# Patient Record
Sex: Female | Born: 1935 | Race: Black or African American | Hispanic: No | Marital: Single | Smoking: Never smoker
Health system: Southern US, Community
[De-identification: ages and names within clinical notes are randomized; demographics above are authoritative.]

## PROBLEM LIST (undated history)

## (undated) DIAGNOSIS — I4891 Unspecified atrial fibrillation: Secondary | ICD-10-CM

## (undated) DIAGNOSIS — M40209 Unspecified kyphosis, site unspecified: Secondary | ICD-10-CM

## (undated) DIAGNOSIS — R001 Bradycardia, unspecified: Secondary | ICD-10-CM

## (undated) DIAGNOSIS — D649 Anemia, unspecified: Secondary | ICD-10-CM

## (undated) DIAGNOSIS — F0390 Unspecified dementia without behavioral disturbance: Secondary | ICD-10-CM

## (undated) DIAGNOSIS — R5381 Other malaise: Secondary | ICD-10-CM

## (undated) DIAGNOSIS — Z86718 Personal history of other venous thrombosis and embolism: Secondary | ICD-10-CM

## (undated) DIAGNOSIS — I10 Essential (primary) hypertension: Secondary | ICD-10-CM

## (undated) DIAGNOSIS — L97919 Non-pressure chronic ulcer of unspecified part of right lower leg with unspecified severity: Secondary | ICD-10-CM

## (undated) DIAGNOSIS — E785 Hyperlipidemia, unspecified: Secondary | ICD-10-CM

## (undated) HISTORY — PX: OTHER SURGICAL HISTORY: SHX169

## (undated) HISTORY — DX: Essential (primary) hypertension: I10

---

## 2001-03-30 ENCOUNTER — Encounter: Payer: Self-pay | Admitting: Emergency Medicine

## 2001-03-30 ENCOUNTER — Emergency Department (HOSPITAL_COMMUNITY): Admission: EM | Admit: 2001-03-30 | Discharge: 2001-03-30 | Payer: Self-pay | Admitting: Emergency Medicine

## 2003-08-08 ENCOUNTER — Ambulatory Visit (HOSPITAL_COMMUNITY): Admission: RE | Admit: 2003-08-08 | Discharge: 2003-08-08 | Payer: Self-pay | Admitting: Cardiology

## 2003-09-22 ENCOUNTER — Emergency Department (HOSPITAL_COMMUNITY): Admission: EM | Admit: 2003-09-22 | Discharge: 2003-09-22 | Payer: Self-pay | Admitting: *Deleted

## 2003-12-19 ENCOUNTER — Ambulatory Visit: Payer: Self-pay | Admitting: Internal Medicine

## 2004-03-01 ENCOUNTER — Ambulatory Visit: Payer: Self-pay | Admitting: Internal Medicine

## 2004-07-16 ENCOUNTER — Ambulatory Visit: Payer: Self-pay | Admitting: Internal Medicine

## 2004-08-06 ENCOUNTER — Ambulatory Visit: Payer: Self-pay | Admitting: Internal Medicine

## 2004-08-11 ENCOUNTER — Ambulatory Visit: Payer: Self-pay | Admitting: Internal Medicine

## 2004-10-04 ENCOUNTER — Ambulatory Visit: Payer: Self-pay | Admitting: Internal Medicine

## 2005-01-20 ENCOUNTER — Emergency Department (HOSPITAL_COMMUNITY): Admission: EM | Admit: 2005-01-20 | Discharge: 2005-01-21 | Payer: Self-pay | Admitting: Emergency Medicine

## 2005-03-25 ENCOUNTER — Ambulatory Visit: Payer: Self-pay | Admitting: Internal Medicine

## 2005-04-16 ENCOUNTER — Emergency Department (HOSPITAL_COMMUNITY): Admission: EM | Admit: 2005-04-16 | Discharge: 2005-04-16 | Payer: Self-pay | Admitting: *Deleted

## 2006-06-30 ENCOUNTER — Ambulatory Visit: Payer: Self-pay | Admitting: Internal Medicine

## 2006-10-24 ENCOUNTER — Observation Stay (HOSPITAL_COMMUNITY): Admission: EM | Admit: 2006-10-24 | Discharge: 2006-10-25 | Payer: Self-pay | Admitting: Emergency Medicine

## 2006-10-25 ENCOUNTER — Ambulatory Visit: Payer: Self-pay | Admitting: Internal Medicine

## 2006-10-26 ENCOUNTER — Telehealth (INDEPENDENT_AMBULATORY_CARE_PROVIDER_SITE_OTHER): Payer: Self-pay | Admitting: *Deleted

## 2006-10-31 ENCOUNTER — Telehealth (INDEPENDENT_AMBULATORY_CARE_PROVIDER_SITE_OTHER): Payer: Self-pay | Admitting: *Deleted

## 2006-11-01 DIAGNOSIS — I10 Essential (primary) hypertension: Secondary | ICD-10-CM | POA: Insufficient documentation

## 2006-11-07 ENCOUNTER — Ambulatory Visit: Payer: Self-pay | Admitting: Internal Medicine

## 2006-11-07 DIAGNOSIS — K2901 Acute gastritis with bleeding: Secondary | ICD-10-CM | POA: Insufficient documentation

## 2006-11-07 LAB — CONVERTED CEMR LAB: Hemoglobin: 12.1 g/dL

## 2007-01-16 ENCOUNTER — Telehealth: Payer: Self-pay | Admitting: Internal Medicine

## 2007-02-21 ENCOUNTER — Telehealth: Payer: Self-pay | Admitting: Internal Medicine

## 2007-04-24 ENCOUNTER — Encounter: Payer: Self-pay | Admitting: Internal Medicine

## 2007-05-10 ENCOUNTER — Telehealth: Payer: Self-pay | Admitting: Internal Medicine

## 2007-05-11 ENCOUNTER — Emergency Department (HOSPITAL_COMMUNITY): Admission: EM | Admit: 2007-05-11 | Discharge: 2007-05-11 | Payer: Self-pay | Admitting: Emergency Medicine

## 2007-05-22 ENCOUNTER — Ambulatory Visit: Payer: Self-pay | Admitting: Internal Medicine

## 2007-05-22 DIAGNOSIS — D649 Anemia, unspecified: Secondary | ICD-10-CM

## 2007-05-22 LAB — CONVERTED CEMR LAB
Basophils Absolute: 0.1 10*3/uL (ref 0.0–0.1)
CO2: 32 meq/L (ref 19–32)
Chloride: 107 meq/L (ref 96–112)
Eosinophils Absolute: 0.2 10*3/uL (ref 0.0–0.7)
Ferritin: 100 ng/mL (ref 10.0–291.0)
GFR calc non Af Amer: 63 mL/min
Lymphocytes Relative: 19.8 % (ref 12.0–46.0)
MCV: 87.2 fL (ref 78.0–100.0)
Monocytes Absolute: 0.5 10*3/uL (ref 0.1–1.0)
Neutro Abs: 5.8 10*3/uL (ref 1.4–7.7)
Neutrophils Relative %: 69.7 % (ref 43.0–77.0)
Platelets: 226 10*3/uL (ref 150–400)
Potassium: 4.1 meq/L (ref 3.5–5.1)
RBC: 4.15 M/uL (ref 3.87–5.11)
WBC: 8.2 10*3/uL (ref 4.5–10.5)

## 2007-05-28 ENCOUNTER — Encounter: Payer: Self-pay | Admitting: Internal Medicine

## 2007-05-29 ENCOUNTER — Ambulatory Visit: Payer: Self-pay | Admitting: Internal Medicine

## 2007-06-14 ENCOUNTER — Telehealth: Payer: Self-pay | Admitting: Internal Medicine

## 2007-06-19 ENCOUNTER — Telehealth: Payer: Self-pay | Admitting: Internal Medicine

## 2007-07-19 ENCOUNTER — Telehealth: Payer: Self-pay | Admitting: Internal Medicine

## 2007-07-24 ENCOUNTER — Telehealth: Payer: Self-pay | Admitting: Internal Medicine

## 2007-12-21 ENCOUNTER — Telehealth: Payer: Self-pay | Admitting: Internal Medicine

## 2008-05-23 ENCOUNTER — Encounter (INDEPENDENT_AMBULATORY_CARE_PROVIDER_SITE_OTHER): Payer: Self-pay | Admitting: *Deleted

## 2008-08-26 ENCOUNTER — Telehealth: Payer: Self-pay | Admitting: Internal Medicine

## 2008-12-04 ENCOUNTER — Ambulatory Visit: Payer: Self-pay | Admitting: Internal Medicine

## 2008-12-04 DIAGNOSIS — I872 Venous insufficiency (chronic) (peripheral): Secondary | ICD-10-CM | POA: Insufficient documentation

## 2009-12-29 ENCOUNTER — Encounter: Payer: Self-pay | Admitting: Internal Medicine

## 2010-01-25 ENCOUNTER — Encounter: Payer: Self-pay | Admitting: Internal Medicine

## 2010-03-11 NOTE — Letter (Signed)
Summary: Medical Necessity for Disposable Briefs and Underpads  Medical Necessity for Disposable Briefs and Underpads   Imported By: Maryln Gottron 01/06/2010 13:44:12  _____________________________________________________________________  External Attachment:    Type:   Image     Comment:   External Document

## 2010-03-11 NOTE — Miscellaneous (Signed)
Summary: Device preload  Clinical Lists Changes  Observations: Added new observation of PPM INDICATN: CHB (01/25/2010 11:41) Added new observation of MAGNET RTE: BOL 85 ERI 65 (01/25/2010 11:41) Added new observation of PPMLEADSTAT1: active (01/25/2010 11:41) Added new observation of PPMLEADSER1: [id number] (01/25/2010 11:41) Added new observation of PPMLEADMOD1: 1226T (01/25/2010 11:41) Added new observation of PPMLEADDOI1: 11/30/1990 (01/25/2010 11:41) Added new observation of PPMLEADLOC1: RV (01/25/2010 11:41) Added new observation of PPM DOI: 08/08/2003 (01/25/2010 11:41) Added new observation of PPM SERL#: [id number] (01/25/2010 11:41) Added new observation of PPM MODL#: [id number] (01/25/2010 11:41) Added new observation of PACEMAKERMFG: Medtronic (01/25/2010 11:41) Added new observation of PPM IMP MD: Duffy Rhody Tennant,MD (01/25/2010 11:41) Added new observation of PPM REFER MD: Rolla Plate (01/25/2010 11:41) Added new observation of PACEMAKER MD: Lewayne Bunting, MD (01/25/2010 11:41)      PPM Specifications Following MD:  Lewayne Bunting, MD     Referring MD:  Rolla Plate PPM Vendor:  Medtronic     PPM Model Number:  303B     PPM Serial Number:  [id number] PPM DOI:  08/08/2003     PPM Implanting MD:  Rolla Plate  Lead 1    Location: RV     DOI: 11/30/1990     Model #: [id number]     Serial #: [id number]     Status: active  Magnet Response Rate:  BOL 85 ERI 65  Indications:  CHB

## 2010-06-22 NOTE — Discharge Summary (Signed)
NAMEARGUSTA, MCGANN            ACCOUNT NO.:  [id number]   MEDICAL RECORD NO.:  [id number]          PATIENT TYPE:  OBV   LOCATION:  6734                         FACILITY:  [facility]   PHYSICIAN:  Valerie A. Felicity Coyer, MDDATE OF BIRTH:  09/12/1920   DATE OF ADMISSION:  10/24/2006  DATE OF DISCHARGE:  10/25/2006                               DISCHARGE SUMMARY   DISCHARGE DIAGNOSES:  1. Nausea and vomiting, with heme-positive gastric fluid,      hemodynamically stable at time of discharge.  2. Hypertension.  Blood pressure at time of discharge is 99/60.  The      patient's Benicar is currently being held. Continue to hold at time      of discharge until further instructions per Dr. Amador Cunas.  3. Chronic venous stasis ulcers.  Plan to continue wound care per home      health as prior to admission.   HISTORY OF PRESENT ILLNESS:  Ms. Lemarr is an 77 year old female who  was admitted on October 24, 2006 with chief complaint of nausea and  vomiting.  She apparently had episodes of abdominal pain with eating,  after which she felt nauseated.  She had two episodes of vomiting prior  to coming to the emergency room.  She did not notice any dark stools or  diarrhea.  She was noted to have positive occult blood in her gastric  fluid and was admitted for further evaluation and observation overnight.   PAST MEDICAL HISTORY:  1. Hypertension.  2. Right leg chronic venous ulceration.   COURSE OF HOSPITALIZATION:  Nausea and vomiting.  The patient was  admitted to she is currently tolerating p.o.'s without any difficulty.  She denies any further nausea and vomiting.  Her hemoglobin is 11.1,  which appears to be her baseline as compared to December 2006 laboratory  value of 11.7.  We will continue Protonix prophylactically at time of  discharge.  There are no active signs of bleed.  Consider outpatient GI  evaluation.  Will defer to patient's primary care Jac Romulus.   MEDICATIONS AT TIME OF  DISCHARGE:  1. Protonix 40 mg p.o. daily.  2. Benicar to be held until further instructions per Dr. Amador Cunas.   FOLLOWUP:  The patient is to follow up with Dr. Amador Cunas in one week.   PERTINENT LABORATORIES AT TIME OF DISCHARGE:  Hemoglobin 1.1, hematocrit  33.7.  LFTs within normal limits.  Amylase, lipase normal.      Sandford Craze, NP      Vikki Ports A. Felicity Coyer, MD  Electronically Signed    MO/MEDQ  D:  10/25/2006  T:  10/26/2006  Job:  [id number]   cc:   Gordy Savers, MD

## 2010-06-22 NOTE — H&P (Signed)
NAMEKEARSTIN, [name]            ACCOUNT NO.:  [id number]   MEDICAL RECORD NO.:  [id number]          PATIENT TYPE:  EMS   LOCATION:  MAJO                         FACILITY:  [facility]   PHYSICIAN:  Hollice Espy, M.D.DATE OF BIRTH:  09/12/1920   DATE OF ADMISSION:  10/24/2006  DATE OF DISCHARGE:                              HISTORY & PHYSICAL   PRIMARY CARE PHYSICIAN:  Dr. Gordy Savers.   CHIEF COMPLAINT:  Nausea and vomiting.   HISTORY OF PRESENT ILLNESS:  The patient is an 77 year old African-  American female with past medical history of hypertension who has been  feeling in previously good health up until today when she started having  episodes of abdominal pain whenever she was eating.  She also felt  nauseated and suddenly had two episodes of vomiting prior to coming to  the emergency room.  She did not notice any diarrhea or dark stools but  felt so ill and with continued abdominal pain she became concerned and  paramedics were called.  The patient was brought in and had a third  episode of emesis in the emergency room. Her blood pressure was stable.  Labs were ordered on the patient and she was found to have a slightly  elevated white blood cell count but a normal hemoglobin.  The ER  physician noted that the patient's emesis was quite dark in nature and  ordered a Hemoccult which was found to be positive.  Given the fact that  the patient lives alone with family near by, they felt it best to keep  her in for overnight observation.  Currently patient is doing well. She  denies any headaches, vision changes, dysphagia, chest pain,  palpitations, shortness of breath, wheeze, cough, abdominal pain,  hematuria, dysuria, constipation, diarrhea, focal weakness, numbness or  pain.  Review of systems otherwise negative.   PAST MEDICAL HISTORY:  1. Hypertension.  2. Right leg chronic venous ulceration.   MEDICATIONS:  She is on Benicar.   ALLERGIES:  No known drug  allergies.   SOCIAL HISTORY:  She denies any tobacco, alcohol or drug use.   FAMILY HISTORY:  Noncontributory.   PHYSICAL EXAMINATION:  VITAL SIGNS:  Temperature 97, heart rate 72,  blood pressure 134/72, respirations 18, O2 saturation 100% on room air.  GENERAL:  She is alert and oriented x3, in no apparent distress.  HEENT:  Normocephalic, atraumatic. Mucous membranes are moist. She has  no carotid bruits.  HEART:  Regular rate and rhythm, 2 out of 6 holosystolic murmur.  LUNGS:  Clear to auscultation bilaterally.  ABDOMEN:  Soft, nontender, nondistended, hyperactive bowel sounds.  EXTREMITIES:      Her right lower extremity is wrapped in an ACE bandage  of which she undergoes dressing changes bi-weekly.  Left lower extremity  had no clubbing, cyanosis or edema, 1+ pulses.   LABORATORY DATA:  White count 8600, H&H 12.2 and 37, MCV of 85, platelet  count (platelet clumps, no number is given but plate count appears to be  adequate), 91% neutrophils.  Liver function tests unremarkable including  normal albumin, amylase is 96, lipase  22, sodium 137, potassium 2.6,  chloride 106, bicarb 23, BUN 18, creatinine 1, glucose 113.  Hemoccult  again is positive.   ASSESSMENT AND PLAN:  1. Hematemesis.  Patient appears to be hemodynamically stable with no      evidence of an acute GI bleed. This may be from some forceful      vomiting. Will plan to keep the patient in the hospital, recheck a      hemoglobin and hematocrit in the morning.  2. Hypertension currently stable and not hypotensive.  3. Noted neutrophil differential on CBC, will recheck again in the      morning.      Hollice Espy, M.D.  Electronically Signed     SKK/MEDQ  D:  10/24/2006  T:  10/24/2006  Job:  [id number]   cc:   Gordy Savers, MD

## 2010-06-25 NOTE — Consult Note (Signed)
NAMELAQUASHA, GROOME                        ACCOUNT NO.:  [id number]   MEDICAL RECORD NO.:  [id number]                   PATIENT TYPE:  EMS   LOCATION:  MAJO                                 FACILITY:  [facility]   PHYSICIAN:  Valetta Mole. Swords, M.D. [facility]           DATE OF BIRTH:  09/12/1920   DATE OF CONSULTATION:  DATE OF DISCHARGE:                                   CONSULTATION   REQUESTING PHYSICIAN:  Dr. Lorre Nick.   CHIEF COMPLAINT:  Sweating.   HISTORY OF PRESENT ILLNESS:  Ms. Outten is an 77 year old female who comes  into the emergency department for evaluation of sweating and weakness,  although I am asked to see the patient in regards to her feet.  In regards  to her sweating and weakness, she had sudden onset today of a period of  weakness described as dizziness and lightheadedness associated with sweating  spells.  This lasted for approximately an hour, not associated with any  chest pain or shortness of breath.  The symptoms resolved spontaneously, and  she now feels well.  She has never had symptoms like this before.   The second problem is she has had chronic venous stasis ulcers, and Dr.  Freida Busman describe to me that she had necrotic gangrene of both legs and needed  to have further evaluation.  She denies any fevers or chills.  She denies  any pain of her legs.  She does admit to chronic ulcers of her legs that she  has tried to treat at home.  She has been noncompliant with home foot care  for the past many years.   PAST MEDICAL HISTORY:  Significant for hypertension.  She has had complete  heart block in 1992 with pacemaker placed at that time, and a generator  replacement was done July 2005.   CURRENT MEDICATIONS:  1. Benicar/hydrochlorothiazide 40/25, one p.o. daily.  2. She takes an aspirin occasionally.   SOCIAL HISTORY:  She lives with two daughters and one grandson.  She is a  nonsmoker.  She does not drink alcohol.   REVIEW OF SYSTEMS:  She denies any  chest pain, shortness of breath, PND,  orthopnea, abdominal pain, change in bowel movements or urinary habits.  She  denies any dysuria, hematuria, GI blood loss or any other complaints in  review of systems other than those listed above.   PHYSICAL EXAMINATION:  VITAL SIGNS:  Temperature 98.3, respirations 20,  heart rate 84, blood pressure 129/72.  GENERAL:  She appears as an elderly, chronically-ill female in no acute  distress.  HEENT:  Atraumatic, normocephalic.  Extraocular muscles are intact.  Oropharynx is moist.  Dentition is poor.  NECK:  Supple without lymphadenopathy, thyromegaly, jugular venous  distension or carotid bruits.  CHEST:  Clear to auscultation.  CARDIAC EXAM:  S1, S2 regular.  PMI is laterally displaced.  She has 2/6  holosystolic murmur at the apex without radiation.  ABDOMEN:  Active bowel sounds, soft, nontender.  There is no  hepatosplenomegaly.  Abdominal aorta feels normal.  PERIPHERAL PULSES:  Femoral, popliteal, dorsalis pedis pulses are all  normal.  EXTREMITIES:  There is no clubbing or cyanosis.  She has significant changes  of venous insufficiency of both legs.  Right leg is worse than left.  She  has ulcers on both legs, both on the lateral and medial malleolus.  She has  significantly overgrown toenails.  Some of the nails have curled around but  are not penetrating skin.  She has significant hypertrophic changes around  some of these ulcers.  NEUROLOGIC:  She is alert and oriented, moves all four extremities without  difficulty.   EKG demonstrates a paced rehabilitation.  Chest x-ray, no acute changes.   LABORATORY DATA:  She had three sets of cardiac markers, all of which are  normal.  Urine microscopy demonstrates 3-6 red blood cells per high-powered  field.  Urinalysis demonstrates a small amount of blood.  CMET is normal  except for a glucose of 103, potassium 3.2.  CBC is normal except for a  white count of 10.6.   ASSESSMENT/PLAN:   1. Subjective symptoms of weakness associated with perspiration, unclear     etiology.  I am not sure if this was a hypotensive spell, vertigo, or     hypoglycemia.  These have resolved spontaneously, and I think there is a     very low likelihood that this is related to a failed pacemaker or any     other cardiac issue. I think she is safe to go home from that standpoint.  2. Foot care.  She clearly does not have gangrenous feet.  She does have     significant hypertrophic changes and changes of venous insufficiency.     She has good peripheral pulses, but terrible foot care.  She needs     further evaluation at the foot center podiatry, and we will schedule that     as an outpatient tomorrow.  This was discussed with Ms. Uselton and two     of her daughters.  All of their questions were encouraged and answered.                                               Bruce Rexene Edison Swords, M.D. [facility]    BHS/MEDQ  D:  09/22/2003  T:  09/22/2003  Job:  [id number]   cc:   Lorre Nick, M.D.  [address] [place], [place] [address]  Fax: [phone]

## 2010-06-25 NOTE — Cardiovascular Report (Signed)
NAMEREBEKA, KIMBLE                        ACCOUNT NO.:  [id number]   MEDICAL RECORD NO.:  [id number]                   PATIENT TYPE:  OIB   LOCATION:  2899                                 FACILITY:  [facility]   PHYSICIAN:  Colleen Can. Deborah Chalk, M.D.            DATE OF BIRTH:  09/12/1920   DATE OF PROCEDURE:  08/08/2003  DATE OF DISCHARGE:                              CARDIAC CATHETERIZATION   INITIAL INDICATION FOR PACEMAKER:  Complete heart block and subsequent end  of life of the parameters necessitating pulse generator replacement.   PROCEDURE:  Right subclavicular area was prepped and draped over the top of  the old pulse generator.  Using sharp and Bovie dissection, the old pulse  generator was explanted.  It was a Development worker, international aid, [place] II, model 2008, date  of implant was 11/30/1990, serial number [id number].  The old lead was evaluated.  It was Surveyor, quantity [id number], serial number [id number].   The following thresholds on the right ventricular lead were 0.8 volts to  capture, a pulse width of 0.5 milliseconds, a return of 1.2 MA, impedance  was 704 ohms, R waves were 5.5 millivolts. The lead was then connected to a  Medtronic pulse generator Sigma model [id number], serial number [id number].  The  unit was sutured in place.  The pocket was enlarged somewhat medially.  It  was flushed copiously with kanamycin solution. It was closed with 2-0 and  subsequently 5-0 Vicryl.  Steri-Strips were applied.  The patient tolerated  the procedure well.                                               Colleen Can. Deborah Chalk, M.D.    SNT/MEDQ  D:  08/08/2003  T:  08/10/2003  Job:  [id number]   cc:   Colleen Can. Deborah Chalk, M.D.  Fax: [phone]

## 2010-06-25 NOTE — H&P (Signed)
[name], [name]                        ACCOUNT NO.:  [id number]   MEDICAL RECORD NO.:  [id number]                   PATIENT TYPE:  OIB   LOCATION:                                       FACILITY:  [facility]   PHYSICIAN:  Colleen Can. Deborah Chalk, M.D.            DATE OF BIRTH:  09/12/1920   DATE OF ADMISSION:  08/08/2003  DATE OF DISCHARGE:  08/08/2003                                HISTORY & PHYSICAL   CHIEF COMPLAINT:  None.   HISTORY OF PRESENT ILLNESS:  Ms. Tvedt is a pleasant 77 year old black  female who presents for elective generator replacement.  She has had a  functioning pacemaker that was implanted originally in 1992.  She now  presents with elective replacement intervals.  Clinically, she has done well  without complaints.   PAST MEDICAL HISTORY:  1. Previous complete heart block with pacemaker implantation in October of     1992 per Dr. Karle Plumber.  2. Chronic stasis ulcers.  3. Hypertension.   ALLERGIES:  None.   CURRENT MEDICINES:  Benicar HCT 40/25 daily.   FAMILY HISTORY:  Noncontributory.   SOCIAL HISTORY:  She does dip snuff.  She has no alcohol use.  She has  several caregivers who aid in her care.   REVIEW OF SYSTEMS:  Basically as noted above.   Clinically, she has done well.  She has had no chest pain and no shortness  of breath.  She has chronic edema of the lower extremities and the right leg  remains chronically wrapped in dressings.  She has had no frank syncope.   PHYSICAL EXAMINATION:  GENERAL:  Very pleasant black female in no acute  distress.  VITAL SIGNS:  Blood pressure 150/80, heart rate 70, respirations 18,  afebrile.  SKIN:  Warm and dry.  Color is unremarkable.  LUNGS:  Basically clear.  HEART:  Shows a regular rhythm.  Pacemaker is in the right pectoral region.  ABDOMEN:  Unremarkable.  EXTREMITIES:  Chronic stasis changes.  There are dressings in place to the  right legs which are chronic in nature as well.   PERTINENT LABS:   Pending.   OVERALL IMPRESSION:  1. End of life pulse generator.  2. Previous history of complete heart block.  3. Hypertension.  4. Chronic stasis ulcers and edema.   PLAN:  We will proceed on with elective generator replacement.  The  procedure has been reviewed to her and her family and she is willing to  proceed on Friday, August 08, 2003.      Sharlee Blew, N.P.                     Colleen Can. Deborah Chalk, M.D.    LC/MEDQ  D:  07/30/2003  T:  07/31/2003  Job:  [id number]   cc:   Gordy Savers, M.D. [facility]

## 2010-06-26 ENCOUNTER — Emergency Department (HOSPITAL_COMMUNITY): Payer: Medicare Other

## 2010-06-26 ENCOUNTER — Emergency Department (HOSPITAL_COMMUNITY)
Admission: EM | Admit: 2010-06-26 | Discharge: 2010-06-26 | Disposition: A | Discharge status: Home / Self Care | Payer: Medicare Other | Attending: Emergency Medicine | Admitting: Emergency Medicine

## 2010-06-26 DIAGNOSIS — R109 Unspecified abdominal pain: Secondary | ICD-10-CM | POA: Insufficient documentation

## 2010-06-26 DIAGNOSIS — K5900 Constipation, unspecified: Secondary | ICD-10-CM | POA: Insufficient documentation

## 2010-06-26 DIAGNOSIS — N390 Urinary tract infection, site not specified: Secondary | ICD-10-CM | POA: Insufficient documentation

## 2010-06-26 DIAGNOSIS — I10 Essential (primary) hypertension: Secondary | ICD-10-CM | POA: Insufficient documentation

## 2010-06-26 LAB — DIFFERENTIAL
Basophils Absolute: 0 10*3/uL (ref 0.0–0.1)
Basophils Relative: 0 % (ref 0–1)

## 2010-06-26 LAB — URINALYSIS, ROUTINE W REFLEX MICROSCOPIC
Nitrite: POSITIVE — AB
pH: 5 (ref 5.0–8.0)

## 2010-06-26 LAB — URINE MICROSCOPIC-ADD ON

## 2010-06-26 LAB — COMPREHENSIVE METABOLIC PANEL
ALT: 18 U/L (ref 0–35)
Albumin: 3.3 g/dL — ABNORMAL LOW (ref 3.5–5.2)
Alkaline Phosphatase: 50 U/L (ref 39–117)
Chloride: 100 mEq/L (ref 96–112)
GFR calc non Af Amer: >60 mL/min (ref >60)
Sodium: 138 mEq/L (ref 135–145)
Total Bilirubin: 0.6 mg/dL (ref 0.3–1.2)
Total Protein: 7 g/dL (ref 6.0–8.3)

## 2010-06-26 LAB — CBC
HCT: 36.9 % (ref 36.0–46.0)
Hemoglobin: 12.1 g/dL (ref 12.0–15.0)
MCV: 83.1 fL (ref 78.0–100.0)
Platelets: 194 10*3/uL (ref 150–400)
RBC: 4.44 MIL/uL (ref 3.87–5.11)

## 2010-06-26 LAB — LIPASE, BLOOD: Lipase: 12 U/L (ref 11–59)

## 2010-06-28 ENCOUNTER — Telehealth: Payer: Self-pay | Admitting: *Deleted

## 2010-06-28 NOTE — Telephone Encounter (Signed)
Spoke with angela - granddaughter - was seen in er - dx with contipation and UTI, on keflex and senna,colace. Family worried because she still seems to be confused and says she is in discomfort. I discussed s/s of dehydration and the importance of he drinking adaquate amounts H2O, jello, broths, apple juice - clear liquids and full liq. , if confusion dosent clear , fever ,or pain worse - to ER or we are to see.  Will inform dr. Amador Cunas.

## 2010-06-28 NOTE — Telephone Encounter (Signed)
The patient's daughter would like to speak with Dr Charm Rings nurse concerning decision they have to make for their mothers health care for the future.

## 2010-06-28 NOTE — Telephone Encounter (Signed)
DONE. kik

## 2010-06-28 NOTE — Telephone Encounter (Signed)
Also suggest MiraLax with 8 ounces of water twice daily

## 2010-06-29 LAB — URINE CULTURE: Culture  Setup Time: 201205200212

## 2010-07-06 ENCOUNTER — Encounter: Payer: Self-pay | Admitting: Internal Medicine

## 2010-07-06 ENCOUNTER — Ambulatory Visit (INDEPENDENT_AMBULATORY_CARE_PROVIDER_SITE_OTHER): Payer: Medicare Other | Admitting: Internal Medicine

## 2010-07-06 DIAGNOSIS — I10 Essential (primary) hypertension: Secondary | ICD-10-CM

## 2010-07-06 DIAGNOSIS — D649 Anemia, unspecified: Secondary | ICD-10-CM

## 2010-07-06 DIAGNOSIS — I872 Venous insufficiency (chronic) (peripheral): Secondary | ICD-10-CM

## 2010-07-06 MED ORDER — FUROSEMIDE 20 MG PO TABS: 20.0000 mg | ORAL_TABLET | Freq: Two times a day (BID) | ORAL | Status: DC

## 2010-07-06 NOTE — Patient Instructions (Signed)
Limit your sodium (Salt) intake  Return in one month for follow-up 

## 2010-07-06 NOTE — Progress Notes (Signed)
  Subjective:    Patient ID: [name], female    DOB: 09/12/1920, 77 y.o.   MRN: [id number]  HPI  an 77 year old patient who is seen today for followup. She is evaluated in the emergency department 10 days ago for abdominal pain these records were reviewed. She has a history of hypertension and presently is on Diovan unclear dose. Previously she had been on Benicar but this was switched to the generic equipment. She has a long history of chronic venous insufficiency with stasis ulceration. She is followed at the wound center. She is status post pacemaker.Marland KitchenMarland KitchenNo past medical history on file. No past surgical history on file.  reports that she has never smoked. Her smokeless tobacco use includes Snuff. She reports that she does not drink alcohol or use illicit drugs. family history is not on file. No Known Allergies     Review of Systems  Constitutional: Negative.        [Wheelchair-bound  Does use a recliner with her legs elevated while at home HENT: Negative for hearing loss, congestion, sore throat, rhinorrhea, dental problem, sinus pressure and tinnitus.   Eyes: Negative for pain, discharge and visual disturbance.  Respiratory: Negative for cough and shortness of breath.   Cardiovascular: Positive for leg swelling. Negative for chest pain and palpitations.  Gastrointestinal: Negative for nausea, vomiting, abdominal pain, diarrhea, constipation, blood in stool and abdominal distention.  Genitourinary: Negative for dysuria, urgency, frequency, hematuria, flank pain, vaginal bleeding, vaginal discharge, difficulty urinating, vaginal pain and pelvic pain.  Musculoskeletal: Negative for joint swelling, arthralgias and gait problem.  Skin: Negative for rash.  Neurological: Negative for dizziness, syncope, speech difficulty, weakness, numbness and headaches.  Hematological: Negative for adenopathy.  Psychiatric/Behavioral: Negative for behavioral problems, dysphoric mood and agitation. The  patient is not nervous/anxious.        Objective:   Physical Exam  Constitutional: She is oriented to person, place, and time. She appears well-developed and well-nourished.       Elderly female sitting in a wheelchair alert no distress  HENT:  Head: Normocephalic.  Right Ear: External ear normal.  Left Ear: External ear normal.  Mouth/Throat: Oropharynx is clear and moist.  Eyes: Conjunctivae and EOM are normal. Pupils are equal, round, and reactive to light.  Neck: Normal range of motion. Neck supple. No thyromegaly present.  Cardiovascular: Normal rate and intact distal pulses.   Murmur heard.      Pacemaker right anterior chest wall Few ectopics Grade 3/6 systolic murmur  Pulmonary/Chest: Effort normal and breath sounds normal.  Abdominal: Soft. Bowel sounds are normal. She exhibits no mass. There is no tenderness.  Musculoskeletal: Normal range of motion. She exhibits edema.       +3 edema distal to knees Venous stasis ulceration  Lymphadenopathy:    She has no cervical adenopathy.  Neurological: She is alert and oriented to person, place, and time.  Skin: Skin is warm and dry. Rash noted.  Psychiatric: She has a normal mood and affect. Her behavior is normal.          Assessment & Plan:   Hypertension  Chronic venous insufficiency with stasis ulceration History of abdominal pain. Stable  We'll add diuretic therapy Recheck in 4 weeks. We'll check electrolytes at that time

## 2010-07-07 ENCOUNTER — Other Ambulatory Visit: Payer: Self-pay | Admitting: *Deleted

## 2010-07-07 NOTE — Telephone Encounter (Signed)
Pt is taking Diovan Hct 160/12.5 mg.

## 2010-07-09 NOTE — Telephone Encounter (Signed)
Dr. K notified. 

## 2010-07-14 ENCOUNTER — Encounter: Payer: Self-pay | Admitting: Internal Medicine

## 2010-08-03 ENCOUNTER — Other Ambulatory Visit: Payer: Self-pay | Admitting: Cardiology

## 2010-08-05 ENCOUNTER — Ambulatory Visit (INDEPENDENT_AMBULATORY_CARE_PROVIDER_SITE_OTHER): Payer: Medicare Other | Admitting: Internal Medicine

## 2010-08-05 ENCOUNTER — Encounter: Payer: Self-pay | Admitting: Internal Medicine

## 2010-08-05 DIAGNOSIS — D649 Anemia, unspecified: Secondary | ICD-10-CM

## 2010-08-05 DIAGNOSIS — I10 Essential (primary) hypertension: Secondary | ICD-10-CM

## 2010-08-05 DIAGNOSIS — I872 Venous insufficiency (chronic) (peripheral): Secondary | ICD-10-CM

## 2010-08-05 MED ORDER — FUROSEMIDE 20 MG PO TABS: 20.0000 mg | ORAL_TABLET | Freq: Two times a day (BID) | ORAL | Status: DC

## 2010-08-05 MED ORDER — VALSARTAN-HYDROCHLOROTHIAZIDE 160-12.5 MG PO TABS: 1.0000 | ORAL_TABLET | Freq: Every day | ORAL | Status: DC

## 2010-08-05 NOTE — Patient Instructions (Signed)
Limit your sodium (Salt) intake  Keep legs elevated as much as possible  Return in 3 months for follow-up 

## 2010-08-05 NOTE — Progress Notes (Signed)
  Subjective:    Patient ID: [name], female    DOB: 09/12/1920, 77 y.o.   MRN: [id number]  HPI  77 year old patient who is in today for followup. She has a history of hypertension that has been well-controlled on Diovan. She is doing quite well today. She has recovered from a URI. She has general debility and is basically wheelchair bound. She does have both facilities at home and not have a wheelchair at home. She has advanced arthritis and chronic venous insufficiency. She has a history of anemia and prior GI bleeding secondary to gastritis. She has no history to suggest recurrent GI bleeding. Her chronic venous insufficiency has been stable   Review of Systems  HENT: Negative for hearing loss, congestion, sore throat, rhinorrhea, dental problem, sinus pressure and tinnitus.   Eyes: Negative for pain, discharge and visual disturbance.  Respiratory: Negative for cough and shortness of breath.   Cardiovascular: Positive for leg swelling. Negative for chest pain and palpitations.  Gastrointestinal: Negative for nausea, vomiting, abdominal pain, diarrhea, constipation, blood in stool and abdominal distention.  Genitourinary: Negative for dysuria, urgency, frequency, hematuria, flank pain, vaginal bleeding, vaginal discharge, difficulty urinating, vaginal pain and pelvic pain.  Musculoskeletal: Positive for back pain and gait problem. Negative for joint swelling and arthralgias.  Skin: Negative for rash.  Neurological: Positive for weakness. Negative for dizziness, syncope, speech difficulty, numbness and headaches.  Hematological: Negative for adenopathy.  Psychiatric/Behavioral: Negative for behavioral problems, dysphoric mood and agitation. The patient is not nervous/anxious.        Objective:   Physical Exam  Constitutional: She is oriented to person, place, and time. She appears well-developed and well-nourished.  HENT:  Head: Normocephalic.  Right Ear: External ear normal.    Left Ear: External ear normal.  Mouth/Throat: Oropharynx is clear and moist.       Appears well-hydrated  Eyes: Conjunctivae and EOM are normal. Pupils are equal, round, and reactive to light.  Neck: Normal range of motion. Neck supple. No thyromegaly present.  Cardiovascular: Normal rate, regular rhythm, normal heart sounds and intact distal pulses.   Pulmonary/Chest: Effort normal and breath sounds normal.       Kyphosis  Abdominal: Soft. Bowel sounds are normal. She exhibits no mass. There is no tenderness.  Musculoskeletal: Normal range of motion. She exhibits edema.       +2 edema with chronic stasis dermatitis  Lymphadenopathy:    She has no cervical adenopathy.  Neurological: She is alert and oriented to person, place, and time.  Skin: Skin is warm and dry. No rash noted.  Psychiatric: She has a normal mood and affect. Her behavior is normal.          Assessment & Plan:   Hypertension well controlled Chronic venous insufficiency stable History of anemia General debility and arthritis. Nonambulatory  We'll set up for a wheelchair as well as a bedside commode for home use  Recheck in 3 months or when necessary  Medications refilled

## 2010-08-10 ENCOUNTER — Telehealth: Payer: Self-pay

## 2010-08-10 NOTE — Telephone Encounter (Signed)
VM from granddaughter - [facility] has faxed ppwk over for [place] to provide ICD code for DME rx'd by dr. Amador Cunas - please call regurding.   I have not seen any ppwk like this - have you? If not I will call

## 2010-08-10 NOTE — Telephone Encounter (Signed)
Please ask [facility] from [facility] to re fax

## 2010-08-10 NOTE — Telephone Encounter (Signed)
Spoke with martina at [facility] - explained we do not have any ppwk - she sttes all they need is a rx - wheelchair ready to be set up for delivery. Fax # [phone] DONE. KIK  Spoke with angel - informed of above conversation and that I have faxed rx to [facility] . KIK

## 2010-08-17 ENCOUNTER — Encounter: Payer: Self-pay | Admitting: Internal Medicine

## 2010-09-21 ENCOUNTER — Encounter: Payer: Self-pay | Admitting: Internal Medicine

## 2010-09-21 ENCOUNTER — Ambulatory Visit (INDEPENDENT_AMBULATORY_CARE_PROVIDER_SITE_OTHER): Payer: Medicare Other | Admitting: Internal Medicine

## 2010-09-21 DIAGNOSIS — I10 Essential (primary) hypertension: Secondary | ICD-10-CM

## 2010-09-21 DIAGNOSIS — Z950 Presence of cardiac pacemaker: Secondary | ICD-10-CM | POA: Insufficient documentation

## 2010-09-21 DIAGNOSIS — I4891 Unspecified atrial fibrillation: Secondary | ICD-10-CM

## 2010-09-21 LAB — PACEMAKER DEVICE OBSERVATION
BATTERY VOLTAGE: 2.76 V
BRDY-0002RV: 70 {beats}/min

## 2010-09-21 NOTE — Assessment & Plan Note (Signed)
Her device is working normally. She is approximately 2-1/2 years before reaching replacement. We'll see her back in several months.

## 2010-09-21 NOTE — Patient Instructions (Signed)
Your physician recommends that you schedule a follow-up appointment in: 6 months with device clinic and 12 months with Dr Ladona Ridgel

## 2010-09-21 NOTE — Assessment & Plan Note (Signed)
Her blood pressure is slightly elevated today. She will continue her current medications including valsartan, hydrochlorothiazide, and furosemide. I've asked her to maintain a low-sodium diet. We'll not be aggressive with blood pressure control based on her advanced age.

## 2010-09-21 NOTE — Progress Notes (Signed)
HPI [name] returns today for followup. She is a pleasant 77 year old woman with a history of symptomatic bradycardia, chronic atrial fibrillation, hypertension, and severe kyphosis. She denies chest pain or shortness of breath. Her appetite is good. She has mild peripheral edema which is chronic. This is thought secondary to venous insufficiency. No Known Allergies   Current Outpatient Prescriptions  Medication Sig Dispense Refill  . docusate sodium (COLACE) 100 MG capsule Take 100 mg by mouth as needed.       . furosemide (LASIX) 20 MG tablet Take 1 tablet (20 mg total) by mouth 2 (two) times daily.  60 tablet  11  . senna (SENOKOT) 8.6 MG tablet Take 1 tablet by mouth as needed.       . valsartan-hydrochlorothiazide (DIOVAN-HCT) 160-12.5 MG per tablet Take 1 tablet by mouth daily.  90 tablet  4     Past Medical History  Diagnosis Date  . High cholesterol   . Heart block   . Ulcer     chronic right leg status ulcer.   Marland Kitchen HTN (hypertension)     ROS:   All systems reviewed and negative except as noted in the HPI.   Past Surgical History  Procedure Date  . Permanent pacemaker      No family history on file.   History   Social History  . Marital Status: Single    Spouse Name: N/A    Number of Children: N/A  . Years of Education: N/A   Occupational History  . Not on file.   Social History Main Topics  . Smoking status: Never Smoker   . Smokeless tobacco: Current User    Types: Snuff  . Alcohol Use: No  . Drug Use: No  . Sexually Active: Not on file   Other Topics Concern  . Not on file   Social History Narrative   Retired, single.      BP 144/67  Pulse 69  Physical Exam:  elderly appearing NAD HEENT: Unremarkable Neck:  No JVD, no thyromegally Lymphatics:  No adenopathy Back:  No CVA tenderness Lungs:  Clear. Well-healed pacemaker incision HEART:  Regular rate rhythm, no murmurs, no rubs, no clicks Abd:  soft, positive bowel sounds, no  organomegally, no rebound, no guarding Ext:  2 plus pulses, no edema, no cyanosis, no clubbing Skin:  No rashes no nodules Neuro:  CN II through XII intact, motor grossly intact  DEVICE  Normal device function.  See PaceArt for details.   Assess/Plan:

## 2010-09-28 ENCOUNTER — Encounter: Payer: Self-pay | Admitting: Internal Medicine

## 2010-10-02 ENCOUNTER — Other Ambulatory Visit: Payer: Self-pay | Admitting: Cardiology

## 2010-10-04 ENCOUNTER — Other Ambulatory Visit: Payer: Self-pay | Admitting: Internal Medicine

## 2010-10-04 MED ORDER — VALSARTAN-HYDROCHLOROTHIAZIDE 160-12.5 MG PO TABS: 1.0000 | ORAL_TABLET | Freq: Every day | ORAL | Status: DC

## 2010-10-04 NOTE — Telephone Encounter (Signed)
Pt had req a refill for valsartan-hydrochlorothiazide (DIOVAN-HCT) 160-12.5 MG per tablet on Friday last week to [facility] on [address]. Pls call in today.  Pt out of med.

## 2010-10-04 NOTE — Telephone Encounter (Signed)
This was efilled on 08/05/10 90 day with 3 refills -to [facility] - pt should have rf's avilb. - sent new rx in [facility]

## 2010-10-04 NOTE — Telephone Encounter (Signed)
escribe medication per fax request  

## 2010-11-18 LAB — CBC
HCT: 36.9
Hemoglobin: 11.1 — ABNORMAL LOW
Hemoglobin: 12.2
MCV: 85
Platelets: ADEQUATE
RBC: 3.92
RBC: 4.34
WBC: 7.4

## 2010-11-18 LAB — I-STAT 8, (EC8 V) (CONVERTED LAB)
Bicarbonate: 22.9
Chloride: 106
pCO2, Ven: 41.3 — ABNORMAL LOW

## 2010-11-18 LAB — DIFFERENTIAL
Basophils Relative: 0
Eosinophils Relative: 0
Lymphocytes Relative: 25
Lymphocytes Relative: 8 — ABNORMAL LOW
Lymphs Abs: 1.8
Monocytes Absolute: 0.7
Monocytes Relative: 1 — ABNORMAL LOW
Monocytes Relative: 10
Neutro Abs: 4.5
Neutro Abs: 7.8 — ABNORMAL HIGH
Neutrophils Relative %: 61

## 2010-11-18 LAB — HEPATIC FUNCTION PANEL
ALT: 9
Albumin: 3.5
Bilirubin, Direct: 0.1
Indirect Bilirubin: 0.5
Total Protein: 7

## 2010-11-18 LAB — GASTRIC OCCULT BLOOD (1-CARD TO LAB)
Occult Blood, Gastric: POSITIVE — AB
pH, Gastric: 5

## 2010-11-18 LAB — POCT I-STAT CREATININE: Operator id: 285491

## 2011-03-01 ENCOUNTER — Telehealth: Payer: Self-pay | Admitting: *Deleted

## 2011-03-01 NOTE — Telephone Encounter (Signed)
Needs to be written and faxed by Dr. Kirtland Bouchard and nurse.

## 2011-03-01 NOTE — Telephone Encounter (Signed)
Pt 's grandaughter is asking for a lift chair for her grandmother, and would like Dr. Kirtland Bouchard to write a prescription for this.

## 2011-03-01 NOTE — Telephone Encounter (Signed)
Spoke with pt's grandaughter and she states she needs as much details as possible for lift chair.  Pt's granddaughter states she will pick up rx and take to [facility].

## 2011-03-01 NOTE — Telephone Encounter (Signed)
ok 

## 2011-03-02 NOTE — Telephone Encounter (Signed)
Pt's granddaughter aware rx ready for pickup

## 2011-05-30 ENCOUNTER — Encounter (HOSPITAL_BASED_OUTPATIENT_CLINIC_OR_DEPARTMENT_OTHER): Payer: Medicare Other | Attending: Internal Medicine

## 2011-05-30 DIAGNOSIS — L97309 Non-pressure chronic ulcer of unspecified ankle with unspecified severity: Secondary | ICD-10-CM | POA: Insufficient documentation

## 2011-05-30 DIAGNOSIS — L97509 Non-pressure chronic ulcer of other part of unspecified foot with unspecified severity: Secondary | ICD-10-CM | POA: Insufficient documentation

## 2011-05-30 NOTE — Progress Notes (Signed)
[facility]  [name], [name]            ACCOUNT NO.:  [id number]  MEDICAL RECORD NO.:  [id number]      DATE OF BIRTH:  April 27, 1918  PHYSICIAN:  [name] Sports. Cheryll [name], M.D.  VISIT DATE:  05/30/2011                                  OFFICE VISIT   HISTORY:  This 78 year old black female comes at the referral of Dr. Karren [name], Doctor in Podiatric Medicine at [facility], for evaluation of ulcerations of the right foot and ankle.  The patient apparently has generally enjoyed excellent health with early diagnosis being that of hypertension.  She has never had a hospitalization or an operation.  She has become friable as her age is advanced, however, and is now essentially wheelchair ridden.  She has been unaware of any vascular problems in her lower extremities either arterial or venous prior to this situation.  Without background, however, the patient several weeks ago developed scaling and ulceration on the medial and lateral aspects of both feet just anterior to the malleoli bilaterally.  She consulted Dr. Elvin [name] who placed her on Amerigel, which apparently contains an ingredient called Oakin, which has healing properties.  They were applied this faithfully while awaiting this visit and when they arrive today, these wounds actually have healed.  Those applications have been going on for approximately 4 weeks.  PHYSICAL EXAMINATION:  GENERAL:  Today, the blood pressure is 156/75, pulse is 70 and regular, respirations 18, temperature 97.6. VITAL SIGNS:  She is an alert, cooperative, pleasant, elderly black female, in no distress, seated in her wheelchair. General examination was not undertaken. EXTREMITIES:  Examination of the right lower extremity shows evidence of scaling and some skin atrophy over most of the foot particularly in the paramalleolar areas both medial and laterally.  There is no open ulceration at this point.  There is some  discoloration of the distal foot suggestive of probable non-critical ischemia and her nails are overgrown and dystrophic.  I am unable to palpate pulses in that foot.  IMPRESSION:  It is my impression that the patient likely had the equivalence venous stasis ulcers and that they have healed relatively quickly and if these are not indeed arterial lesions instead, and that they are healed, no further direct intervention at this point would appear indicated, although I have suggested to the family that they continue with the Amerigel for at least several additional weeks until most of the dried and loosened skin has been thus cleared away.  We have discussed the importance of avoiding trauma to that extremity particularly a pressure on either the heel or elsewhere on the distal lower extremity when she is in her wheelchair.  She is advised to keep the leg elevated as much as possible, which she is able to do without having ischemic pain.  She does have some edema at this time, which I would place at approximately 2-3 plus on a chronic basis, with very little soft pitting.  Hopefully that will improve with such elevation.  She has left an open door with respect to returning here if her problems recur or she has other wound care-type concerns.  Otherwise, her return here can be purely on a p.r.n. basis.  Incidentally, her only past medical history is that of hypertension as indicated.  She has had no surgeries, except the pacemaker placement 3 years ago.  Her regular medications include only Lasix 20 mg b.i.d., Colace, Senokot, and Diovan HCT 160/12.5, taken on a daily basis.          ______________________________ [name], M.D.     RES/MEDQ  D:  05/30/2011  T:  05/30/2011  Job:  [id number]  cc:   Merwyn Katos, DPM

## 2011-06-30 ENCOUNTER — Encounter: Payer: Self-pay | Admitting: *Deleted

## 2011-08-03 ENCOUNTER — Emergency Department (HOSPITAL_COMMUNITY): Payer: Medicare Other

## 2011-08-03 ENCOUNTER — Inpatient Hospital Stay (HOSPITAL_COMMUNITY)
Admission: EM | Admit: 2011-08-03 | Discharge: 2011-08-05 | DRG: 690 | Disposition: A | Discharge status: Home / Self Care | Payer: Medicare Other | Attending: Internal Medicine | Admitting: Internal Medicine

## 2011-08-03 ENCOUNTER — Encounter (HOSPITAL_COMMUNITY): Payer: Self-pay | Admitting: Emergency Medicine

## 2011-08-03 DIAGNOSIS — N390 Urinary tract infection, site not specified: Principal | ICD-10-CM | POA: Diagnosis present

## 2011-08-03 DIAGNOSIS — F0390 Unspecified dementia without behavioral disturbance: Secondary | ICD-10-CM | POA: Diagnosis present

## 2011-08-03 DIAGNOSIS — R5381 Other malaise: Secondary | ICD-10-CM

## 2011-08-03 DIAGNOSIS — E876 Hypokalemia: Secondary | ICD-10-CM | POA: Diagnosis present

## 2011-08-03 DIAGNOSIS — R5383 Other fatigue: Secondary | ICD-10-CM

## 2011-08-03 DIAGNOSIS — R638 Other symptoms and signs concerning food and fluid intake: Secondary | ICD-10-CM | POA: Diagnosis present

## 2011-08-03 DIAGNOSIS — Z66 Do not resuscitate: Secondary | ICD-10-CM | POA: Diagnosis present

## 2011-08-03 DIAGNOSIS — Z950 Presence of cardiac pacemaker: Secondary | ICD-10-CM

## 2011-08-03 DIAGNOSIS — D72829 Elevated white blood cell count, unspecified: Secondary | ICD-10-CM

## 2011-08-03 DIAGNOSIS — E782 Mixed hyperlipidemia: Secondary | ICD-10-CM

## 2011-08-03 LAB — COMPREHENSIVE METABOLIC PANEL
ALT: 6 U/L (ref 0–35)
Alkaline Phosphatase: 50 U/L (ref 39–117)
BUN: 23 mg/dL (ref 6–23)
CO2: 25 mEq/L (ref 19–32)
GFR calc Af Amer: 83 mL/min — ABNORMAL LOW (ref >90)
GFR calc non Af Amer: 71 mL/min — ABNORMAL LOW (ref >90)
Glucose, Bld: 94 mg/dL (ref 70–99)
Potassium: 3.4 mEq/L — ABNORMAL LOW (ref 3.5–5.1)
Sodium: 142 mEq/L (ref 135–145)
Total Protein: 7.2 g/dL (ref 6.0–8.3)

## 2011-08-03 LAB — CBC WITH DIFFERENTIAL/PLATELET
Eosinophils Absolute: 0 10*3/uL (ref 0.0–0.7)
Eosinophils Relative: 0 % (ref 0–5)
Hemoglobin: 13 g/dL (ref 12.0–15.0)
Lymphocytes Relative: 5 % — ABNORMAL LOW (ref 12–46)
Lymphs Abs: 0.8 10*3/uL (ref 0.7–4.0)
MCH: 27.7 pg (ref 26.0–34.0)
MCV: 83.8 fL (ref 78.0–100.0)
Monocytes Relative: 6 % (ref 3–12)
Neutrophils Relative %: 89 % — ABNORMAL HIGH (ref 43–77)
Platelets: 206 10*3/uL (ref 150–400)
RBC: 4.69 MIL/uL (ref 3.87–5.11)
WBC: 15.7 10*3/uL — ABNORMAL HIGH (ref 4.0–10.5)

## 2011-08-03 LAB — URINALYSIS, ROUTINE W REFLEX MICROSCOPIC
Specific Gravity, Urine: 1.026 (ref 1.005–1.030)
pH: 5 (ref 5.0–8.0)

## 2011-08-03 LAB — URINE MICROSCOPIC-ADD ON

## 2011-08-03 MED ORDER — ENOXAPARIN SODIUM 30 MG/0.3ML ~~LOC~~ SOLN
30.0000 mg | SUBCUTANEOUS | Status: DC
  Administered 2011-08-03: 30 mg via SUBCUTANEOUS
  Filled 2011-08-03 (×2): qty 0.3

## 2011-08-03 MED ORDER — DEXTROSE 5 % IV SOLN
1.0000 g | Freq: Once | INTRAVENOUS | Status: AC
  Administered 2011-08-03: 1 g via INTRAVENOUS
  Filled 2011-08-03: qty 10

## 2011-08-03 MED ORDER — POTASSIUM CHLORIDE CRYS ER 20 MEQ PO TBCR
40.0000 meq | EXTENDED_RELEASE_TABLET | Freq: Once | ORAL | Status: DC
  Filled 2011-08-03: qty 2

## 2011-08-03 MED ORDER — ONDANSETRON HCL 4 MG/2ML IJ SOLN: 4.0000 mg | Freq: Four times a day (QID) | INTRAMUSCULAR | Status: DC | PRN

## 2011-08-03 MED ORDER — SODIUM CHLORIDE 0.9 % IV SOLN
INTRAVENOUS | Status: DC
  Administered 2011-08-03 – 2011-08-04 (×2): via INTRAVENOUS

## 2011-08-03 MED ORDER — DEXTROSE 5 % IV SOLN
1.0000 g | INTRAVENOUS | Status: DC
  Administered 2011-08-04: 1 g via INTRAVENOUS
  Filled 2011-08-03 (×4): qty 10

## 2011-08-03 MED ORDER — ONDANSETRON HCL 4 MG PO TABS: 4.0000 mg | ORAL_TABLET | Freq: Four times a day (QID) | ORAL | Status: DC | PRN

## 2011-08-03 MED ORDER — SODIUM CHLORIDE 0.9 % IV BOLUS (SEPSIS)
500.0000 mL | Freq: Once | INTRAVENOUS | Status: AC
  Administered 2011-08-03: 500 mL via INTRAVENOUS

## 2011-08-03 NOTE — ED Notes (Signed)
Pt repositioned to her left side. Pillow placed under right sacral side and additional pillow placed under left arm

## 2011-08-03 NOTE — H&P (Signed)
Patient's PCP: Rogelia Boga, MD  Chief Complaint: brought in by family for lethargy and decreased PO intake  History of Present Illness: [name] is a 78 y.o. AA female who was brought in by family because they suspected UTI. For the past few days patient has had decreased PO intake and been lethargic (different from usual).  Daughter has been living with patient for the last year.  Patient does not walk.  Patient is demented so most history is obtained from family.  Code status was discussed and family and patient wish to be a DNR/DNI.    In ER/EMS was given IVF for hypotension.     Meds: Scheduled Meds:    . cefTRIAXone (ROCEPHIN)  IV  1 g Intravenous Once  . sodium chloride  500 mL Intravenous Once   Continuous Infusions:    . sodium chloride 125 mL/hr at 08/03/11 1429   PRN Meds:. Allergies: Review of patient's allergies indicates no known allergies. Past Medical History  Diagnosis Date  . High cholesterol   . Heart block   . Ulcer     chronic right leg status ulcer.   Marland Kitchen HTN (hypertension)    Past Surgical History  Procedure Date  . Permanent pacemaker    History reviewed. No pertinent family history. History   Social History  . Marital Status: Single    Spouse Name: N/A    Number of Children: N/A  . Years of Education: N/A   Occupational History  . Not on file.   Social History Main Topics  . Smoking status: Never Smoker   . Smokeless tobacco: Current User    Types: Snuff  . Alcohol Use: No  . Drug Use: No  . Sexually Active: No   Other Topics Concern  . Not on file   Social History Narrative   Retired, single.    Review of Systems: Unable to do full as patient is demented and only speaks a few words   Physical Exam: Blood pressure 99/61, pulse 69, temperature 99.9 F (37.7 C), temperature source Axillary, resp. rate 21, SpO2 96.00%. General: Awake, Oriented x3, No acute distress. HEENT: EOMI, dry mucous membranes Neck:  Supple CV: S1 and S2, rrr Lungs: Clear to ascultation bilaterally, no wheezing Abdomen: Soft, Nontender, Nondistended, +bowel sounds. Ext: Good pulses. Trace edema. No clubbing or cyanosis noted. Neuro: Cranial Nerves II-XII grossly intact moves all 4 extremities equal    Lab results:  Basename 08/03/11 1200  NA 142  K 3.4*  CL 104  CO2 25  GLUCOSE 94  BUN 23  CREATININE 0.78  CALCIUM 10.6*  MG --  PHOS --    Basename 08/03/11 1200  AST 15  ALT 6  ALKPHOS 50  BILITOT 0.7  PROT 7.2  ALBUMIN 3.0*   No results found for this basename: LIPASE:2,AMYLASE:2 in the last 72 hours  Basename 08/03/11 1200  WBC 15.7*  NEUTROABS 14.0*  HGB 13.0  HCT 39.3  MCV 83.8  PLT 206   No results found for this basename: CKTOTAL:3,CKMB:3,CKMBINDEX:3,TROPONINI:3 in the last 72 hours No components found with this basename: POCBNP:3 No results found for this basename: DDIMER in the last 72 hours No results found for this basename: HGBA1C:2 in the last 72 hours No results found for this basename: CHOL:2,HDL:2,LDLCALC:2,TRIG:2,CHOLHDL:2,LDLDIRECT:2 in the last 72 hours No results found for this basename: TSH,T4TOTAL,FREET3,T3FREE,THYROIDAB in the last 72 hours No results found for this basename: VITAMINB12:2,FOLATE:2,FERRITIN:2,TIBC:2,IRON:2,RETICCTPCT:2 in the last 72 hours Imaging results:  Dg Chest Port 1  View  08/03/2011  *RADIOLOGY REPORT*  Clinical Data: Fever and weakness.  PORTABLE CHEST - 1 VIEW  Comparison: Chest x-ray 01/21/2005.  Findings: Assessment of the lung apices and upper mediastinum is limited by patient's kyphotic positioning.  Within the well visualized portions of the thorax there is no definite acute consolidative airspace disease, and no definite pleural effusions. Pulmonary vasculature appears normal.  Heart size is mildly enlarged (unchanged).  Atherosclerotic calcifications within the arch of the aorta.  Right-sided pacemaker device in place with the tip projecting  over the expected location of the right ventricular apex.  IMPRESSION: 1.  No definite radiographic evidence of acute cardiopulmonary disease. 2.  Mild cardiomegaly. 3.  Atherosclerosis.  Original Report Authenticated By: Florencia Reasons, M.D.     Assessment & Plan by Problem: Active Problems:  UTI (lower urinary tract infection)- rocephin day #1, grew e coli in 5/12 sensitive to rocephin  Dementia- baseline  Lethargy- should improve with abx  Decreased oral intake- nutrition to see  Hypokalemia- replace  D/C plan back home with family when cultures back    Time spent on admission, talking to the patient, and coordinating care was: 45 mins.  Jhony Antrim, DO 08/03/2011, 4:19 PM

## 2011-08-03 NOTE — ED Notes (Signed)
Attempted to call Ester rn. Rn in room with pt will call me back

## 2011-08-03 NOTE — ED Notes (Signed)
Pt presenting to ed with c/o fever, bilateral side pain, diarrhea and decreased appetite since yesterday per daughter at bedside. Pt is disoriented at this time. Pt is alert. Pt with stool noted. Pt cleaned. Per ptar pt was hypotensive 88/58, 94% ra, hr-72, cbg-95

## 2011-08-03 NOTE — ED Provider Notes (Signed)
History     CSN: [id number]  Arrival date & time 08/03/11  1137   First MD Initiated Contact with Patient 08/03/11 1144      Chief Complaint  Patient presents with  . Fever    (Consider location/radiation/quality/duration/timing/severity/associated sxs/prior treatment) HPI Comments: [name] is a 78 y.o. Female here for fever, decreased appetite, and suspected urinary tract infection. The patient cannot give history. She was treated at the scene by EMS with IV fluids for hypotension; 88/58.   Level V Caveat- dementia  Patient is a 78 y.o. female presenting with fever. The history is provided by the patient.  Fever Primary symptoms of the febrile illness include fever.    Past Medical History  Diagnosis Date  . High cholesterol   . Heart block   . Ulcer     chronic right leg status ulcer.   Marland Kitchen HTN (hypertension)     Past Surgical History  Procedure Date  . Permanent pacemaker     No family history on file.  History  Substance Use Topics  . Smoking status: Never Smoker   . Smokeless tobacco: Current User    Types: Snuff  . Alcohol Use: No    OB History    Grav Para Term Preterm Abortions TAB SAB Ect Mult Living                  Review of Systems  Unable to perform ROS Constitutional: Positive for fever.    Allergies  Review of patient's allergies indicates no known allergies.  Home Medications   Current Outpatient Rx  Name Route Sig Dispense Refill  . VALSARTAN-HYDROCHLOROTHIAZIDE 160-12.5 MG PO TABS Oral Take 1 tablet by mouth daily. 90 tablet 4    BP 108/60  Pulse 68  Temp 100.8 F (38.2 C) (Rectal)  Resp 15  SpO2 100%  Physical Exam  Nursing note and vitals reviewed. Constitutional: She appears well-developed.       Frail, sleepy  HENT:  Head: Normocephalic and atraumatic.  Eyes: Conjunctivae and EOM are normal. Pupils are equal, round, and reactive to light.  Neck: Normal range of motion and phonation normal. Neck supple.    Cardiovascular: Normal rate, regular rhythm and intact distal pulses.   Pulmonary/Chest: Effort normal and breath sounds normal. No respiratory distress. She has no wheezes. She exhibits no tenderness.  Abdominal: Soft. She exhibits no distension. There is tenderness (Mild diffuse). There is no guarding.  Musculoskeletal: Normal range of motion.       Chronic-appearing right lower leg swelling, with evidence for prior ulcers; no apparent erythema, streaking or fluctuance.  Neurological: She is alert. She has normal strength. She exhibits normal muscle tone. Coordination normal.  Skin: Skin is warm and dry.  Psychiatric: Her behavior is normal.    ED Course  Procedures (including critical care time)  Emergency department treatment: IV fluids, and IV, Rocephin. Admission arranged   Labs Reviewed  URINALYSIS, ROUTINE W REFLEX MICROSCOPIC - Abnormal; Notable for the following:    Color, Urine AMBER (*)  BIOCHEMICALS MAY BE AFFECTED BY COLOR   APPearance CLOUDY (*)     Hgb urine dipstick SMALL (*)     Bilirubin Urine SMALL (*)     Leukocytes, UA MODERATE (*)     All other components within normal limits  CBC WITH DIFFERENTIAL - Abnormal; Notable for the following:    WBC 15.7 (*)     Neutrophils Relative 89 (*)     Neutro Abs 14.0 (*)  Lymphocytes Relative 5 (*)     All other components within normal limits  COMPREHENSIVE METABOLIC PANEL - Abnormal; Notable for the following:    Potassium 3.4 (*)     Calcium 10.6 (*)     Albumin 3.0 (*)     GFR calc non Af Amer 71 (*)     GFR calc Af Amer 83 (*)     All other components within normal limits  URINE MICROSCOPIC-ADD ON - Abnormal; Notable for the following:    Squamous Epithelial / LPF FEW (*)     Bacteria, UA MANY (*)     All other components within normal limits  URINE CULTURE   Dg Chest Port 1 View  08/03/2011  *RADIOLOGY REPORT*  Clinical Data: Fever and weakness.  PORTABLE CHEST - 1 VIEW  Comparison: Chest x-ray  01/21/2005.  Findings: Assessment of the lung apices and upper mediastinum is limited by patient's kyphotic positioning.  Within the well visualized portions of the thorax there is no definite acute consolidative airspace disease, and no definite pleural effusions. Pulmonary vasculature appears normal.  Heart size is mildly enlarged (unchanged).  Atherosclerotic calcifications within the arch of the aorta.  Right-sided pacemaker device in place with the tip projecting over the expected location of the right ventricular apex.  IMPRESSION: 1.  No definite radiographic evidence of acute cardiopulmonary disease. 2.  Mild cardiomegaly. 3.  Atherosclerosis.  Original Report Authenticated By: Florencia Reasons, M.D.     1. UTI (lower urinary tract infection)       MDM  Altered mental status, and decreased appetite, secondary to urinary tract infection. Patient is hemodynamically stable. She'll need to be admitted for stabilization.   Plan: Admit- [facility]    Flint Melter, MD 08/03/11 [phone]

## 2011-08-03 NOTE — ED Notes (Signed)
[id number]<YQ> Expected date:08/03/11<BR> Expected time:11:27 AM<BR> Means of arrival:Ambulance<BR> Comments:<BR> 78yo/f hypotensive

## 2011-08-03 NOTE — ED Notes (Signed)
Report to ester rn 

## 2011-08-04 DIAGNOSIS — E782 Mixed hyperlipidemia: Secondary | ICD-10-CM

## 2011-08-04 DIAGNOSIS — E876 Hypokalemia: Secondary | ICD-10-CM

## 2011-08-04 DIAGNOSIS — R5381 Other malaise: Secondary | ICD-10-CM

## 2011-08-04 DIAGNOSIS — R5383 Other fatigue: Secondary | ICD-10-CM

## 2011-08-04 DIAGNOSIS — F0390 Unspecified dementia without behavioral disturbance: Secondary | ICD-10-CM

## 2011-08-04 LAB — BASIC METABOLIC PANEL
CO2: 24 mEq/L (ref 19–32)
Calcium: 9.1 mg/dL (ref 8.4–10.5)
Creatinine, Ser: 0.78 mg/dL (ref 0.50–1.10)
GFR calc Af Amer: 83 mL/min — ABNORMAL LOW (ref >90)
GFR calc non Af Amer: 71 mL/min — ABNORMAL LOW (ref >90)
Sodium: 142 mEq/L (ref 135–145)

## 2011-08-04 LAB — CBC
MCV: 84.5 fL (ref 78.0–100.0)
Platelets: 182 10*3/uL (ref 150–400)
RBC: 3.93 MIL/uL (ref 3.87–5.11)
RDW: 14.8 % (ref 11.5–15.5)
WBC: 16.9 10*3/uL — ABNORMAL HIGH (ref 4.0–10.5)

## 2011-08-04 MED ORDER — BOOST / RESOURCE BREEZE PO LIQD
1.0000 | Freq: Two times a day (BID) | ORAL | Status: DC
  Administered 2011-08-04 – 2011-08-05 (×4): 1 via ORAL
  Filled 2011-08-04 (×4): qty 1

## 2011-08-04 MED ORDER — ENOXAPARIN SODIUM 40 MG/0.4ML ~~LOC~~ SOLN
40.0000 mg | SUBCUTANEOUS | Status: DC
  Administered 2011-08-04: 40 mg via SUBCUTANEOUS
  Filled 2011-08-04 (×2): qty 0.4

## 2011-08-04 MED ORDER — POTASSIUM CHLORIDE 10 MEQ/100ML IV SOLN
10.0000 meq | INTRAVENOUS | Status: AC
  Administered 2011-08-04 (×2): 10 meq via INTRAVENOUS
  Filled 2011-08-04 (×2): qty 100

## 2011-08-04 NOTE — Progress Notes (Addendum)
[facility] PROGRESS NOTE  [name]:[id number] DOB: [birth year] DOA: 08/03/2011 PCP: Rogelia Boga, MD  Assessment/Plan: 1. UTI- rocephin day #2- await culture 2. Dementia 3. Lethargy- patient seems to be more awake today 4. hypokalemia- replace 5. Decreased oral intake- encourage eating, nutrition consulted 6. Leukocytosis- monitor for resolution 7. Not ambulatory at baseline- secondary to dementia/deconditioning  Code Status: DNR Family Communication: updated daughter at bedside Disposition Plan: home with family  Marlin Canary, D.O.  [facility] Pager [phone]  08/04/2011, 9:02 AM  LOS: 1 day    Subjective: Says she is not hungry Denies pain No fever, no chills   Objective: Filed Vitals:   08/03/11 1748 08/03/11 1855 08/03/11 2200 08/04/11 0600  BP: 95/63 97/54 92/54  106/59  Pulse: 69 71 74 70  Temp: 98.5 F (36.9 C) 99.7 F (37.6 C) 98 F (36.7 C) 99.6 F (37.6 C)  TempSrc: Axillary Axillary Axillary Oral  Resp: 16 22 18 18   Height:  5\' 5"  (1.651 m)    Weight:  48.8 kg (107 lb 9.4 oz)    SpO2: 100% 96% 99% 98%    Intake/Output Summary (Last 24 hours) at 08/04/11 0902 Last data filed at 08/04/11 0600  Gross per 24 hour  Intake 1939.58 ml  Output      1 ml  Net 1938.58 ml    Exam:  Blood pressure 99/61, pulse 69, temperature 99.9 F (37.7 C), temperature source Axillary, resp. rate 21, SpO2 96.00%.  General: Awake, Oriented x3, No acute distress.  HEENT: EOMI, dry mucous membranes  Neck: Supple  CV: S1 and S2, rrr  Lungs: Clear to ascultation bilaterally, no wheezing  Abdomen: Soft, Nontender, Nondistended, +bowel sounds.  Ext: Good pulses. Trace edema. No clubbing or cyanosis noted.  Neuro: Cranial Nerves II-XII grossly intact moves all 4 extremities equal   Data Reviewed: Basic Metabolic Panel:  Lab 08/04/11 3244 08/03/11 1200  NA 142 142  K 3.0* 3.4*  CL 110 104  CO2 24 25  GLUCOSE 86  94  BUN 24* 23  CREATININE 0.78 0.78  CALCIUM 9.1 10.6*  MG -- --  PHOS -- --   Liver Function Tests:  Lab 08/03/11 1200  AST 15  ALT 6  ALKPHOS 50  BILITOT 0.7  PROT 7.2  ALBUMIN 3.0*   No results found for this basename: LIPASE:5,AMYLASE:5 in the last 168 hours No results found for this basename: AMMONIA:5 in the last 168 hours CBC:  Lab 08/04/11 0505 08/03/11 1200  WBC 16.9* 15.7*  NEUTROABS -- 14.0*  HGB 11.2* 13.0  HCT 33.2* 39.3  MCV 84.5 83.8  PLT 182 206   Cardiac Enzymes: No results found for this basename: CKTOTAL:5,CKMB:5,CKMBINDEX:5,TROPONINI:5 in the last 168 hours BNP: No components found with this basename: POCBNP:5 CBG: No results found for this basename: GLUCAP:5 in the last 168 hours  No results found for this or any previous visit (from the past 240 hour(s)).   Studies: Dg Chest Port 1 View  08/03/2011  *RADIOLOGY REPORT*  Clinical Data: Fever and weakness.  PORTABLE CHEST - 1 VIEW  Comparison: Chest x-ray 01/21/2005.  Findings: Assessment of the lung apices and upper mediastinum is limited by patient's kyphotic positioning.  Within the well visualized portions of the thorax there is no definite acute consolidative airspace disease, and no definite pleural effusions. Pulmonary vasculature appears normal.  Heart size is mildly enlarged (unchanged).  Atherosclerotic calcifications within the arch of the aorta.  Right-sided pacemaker device in place with  the tip projecting over the expected location of the right ventricular apex.  IMPRESSION: 1.  No definite radiographic evidence of acute cardiopulmonary disease. 2.  Mild cardiomegaly. 3.  Atherosclerosis.  Original Report Authenticated By: Florencia Reasons, M.D.    Scheduled Meds:   . cefTRIAXone (ROCEPHIN)  IV  1 g Intravenous Once  . cefTRIAXone (ROCEPHIN)  IV  1 g Intravenous Q24H  . enoxaparin  30 mg Subcutaneous Q24H  . potassium chloride  10 mEq Intravenous Q1 Hr x 2  . potassium chloride  40  mEq Oral Once  . sodium chloride  500 mL Intravenous Once   Continuous Infusions:   . sodium chloride 125 mL/hr at 08/03/11 1429

## 2011-08-04 NOTE — Progress Notes (Signed)
INITIAL ADULT NUTRITION ASSESSMENT Date: 08/04/2011   Time: 8:36 AM Reason for Assessment: Consult for poor PO   ASSESSMENT: Female 78 y.o.  Dx: lethargy and poor PO  Hx:  Past Medical History  Diagnosis Date  . High cholesterol   . Heart block   . Ulcer     chronic right leg status ulcer.   Marland Kitchen HTN (hypertension)     Related Meds:  Scheduled Meds:   . cefTRIAXone (ROCEPHIN)  IV  1 g Intravenous Once  . cefTRIAXone (ROCEPHIN)  IV  1 g Intravenous Q24H  . enoxaparin  30 mg Subcutaneous Q24H  . potassium chloride  10 mEq Intravenous Q1 Hr x 2  . potassium chloride  40 mEq Oral Once  . sodium chloride  500 mL Intravenous Once   Continuous Infusions:   . sodium chloride 125 mL/hr at 08/03/11 1429   PRN Meds:.ondansetron (ZOFRAN) IV, ondansetron   Ht: 5\' 5"  (165.1 cm)  Wt: 107 lb 9.4 oz (48.8 kg)  Ideal Wt: 57 kg  % Ideal Wt: 85.6% Wt Readings from Last 10 Encounters:  08/03/11 107 lb 9.4 oz (48.8 kg)    Usual Wt: 110 lb per patient's family member.  % Usual Wt: 97.2%  Body mass index is 17.90 kg/(m^2). (Underweight)  Food/Nutrition Related Hx: Patient and family member reported patient with good appetite and intake PTA. Patient eating breakfast meal at time of RD visit. Patient appears very thin. Noted, patient with trace edema. Patient denies any problems chewing or swallowing. Patient reported she has tried Ensure and Boost and dislikes it. Patient voiced snack preferences and agreed to try Resource Breeze Nutrition supplement.   Labs:  CMP     Component Value Date/Time   NA 142 08/04/2011 0505   K 3.0* 08/04/2011 0505   CL 110 08/04/2011 0505   CO2 24 08/04/2011 0505   GLUCOSE 86 08/04/2011 0505   BUN 24* 08/04/2011 0505   CREATININE 0.78 08/04/2011 0505   CALCIUM 9.1 08/04/2011 0505   PROT 7.2 08/03/2011 1200   ALBUMIN 3.0* 08/03/2011 1200   AST 15 08/03/2011 1200   ALT 6 08/03/2011 1200   ALKPHOS 50 08/03/2011 1200   BILITOT 0.7 08/03/2011 1200   GFRNONAA 71*  08/04/2011 0505   GFRAA 83* 08/04/2011 0505     Intake/Output Summary (Last 24 hours) at 08/04/11 0838 Last data filed at 08/04/11 0600  Gross per 24 hour  Intake 1939.58 ml  Output      1 ml  Net 1938.58 ml  *Fluid retention noted.    Diet Order: General  Supplements/Tube Feeding: none at this time.   IVF:    sodium chloride Last Rate: 125 mL/hr at 08/03/11 1429    Estimated Nutritional Needs:   Kcal: 1100-1400 Protein: 58-73 grams Fluid: fluid per MD request  NUTRITION DIAGNOSIS: -Increased nutrient needs (NI-5.1).  Status: Ongoing  RELATED TO: pt meets underweight criteria  AS EVIDENCE BY: BMI of 17.8 kg/m^2.   MONITORING/EVALUATION(Goals): PO intake, weights, labs 1. PO intake > 75% at meals, snacks and supplements. 2. Minimize weight loss.   EDUCATION NEEDS: -No education needs identified at this time  INTERVENTION: 1. Will order patient resource breeze BID. Provides 500 kcal and 18 grams of protein daily.  2. Will order patient snacks BID to increase PO intake.  3. RD to follow for nutrition plan of care.   Dietitian [phone]  DOCUMENTATION CODES Per approved criteria  -Underweight    Iven Finn [facility] 08/04/2011, 8:36 AM

## 2011-08-04 NOTE — Clinical Documentation Improvement (Signed)
GENERIC DOCUMENTATION CLARIFICATION QUERY  THIS DOCUMENT IS NOT A PERMANENT PART OF THE MEDICAL RECORD  TO RESPOND TO THE THIS QUERY, FOLLOW THE INSTRUCTIONS BELOW:  1. If needed, update documentation for the patient's encounter via the notes activity.  2. Access this query again and click edit on the In [employer].  3. After updating, or not, click F2 to complete all highlighted (required) fields concerning your review. Select "additional documentation in the medical record" OR "no additional documentation provided".  4. Click Sign note button.  5. The deficiency will fall out of your In Basket *Please let us know if you are not able to complete this workflow by phone or e-mail (listed below).  Please update your documentation within the medical record to reflect your response to this query.                                                                                        08/04/11   Dear Dr. Benjamine Mola / Associates,  In a better effort to capture your patient's severity of illness, reflect appropriate length of stay and utilization of resources, a review of the patient medical record has revealed the following indicators.    Based on your clinical judgment, please clarify and document in a progress note and/or discharge summary the clinical condition associated with the following supporting information:  In responding to this query please exercise your independent judgment.  The fact that a query is asked, does not imply that any particular answer is desired or expected.  Pt admitted with UTI.  According to H&P, "pt does not walk"  ... For accurate  specificity & severity, if possible , please specify if "not ambulatory status "can  be further clarified in progress notes as any of listings below.  -Functional Quadraplegia  _Other Condition__________________ Shanon Rosser Clinically Determine    Supporting Information: Risk Factors:Dememtia,    Signs & Symptoms: per  progress not on  08/04/11 : "Not ambulatory at baseline"                                 Braden Scale: activity : bedfast                                                        mobility: very limited                                 Fall Risk : High   Treatment: Repositioned every 2 hrs,                     Safety precautions  You may use possible, probable, or suspect with inpatient documentation. possible, probable, suspected diagnoses MUST be documented at the time of discharge  Reviewed: additional documentation in the medical record  Thank You,  Andy Gauss RN  Clinical  Documentation Specialist:  Pager [phone] E-mail [email or web address]  Health Information Management [place]

## 2011-08-05 DIAGNOSIS — R5381 Other malaise: Secondary | ICD-10-CM

## 2011-08-05 DIAGNOSIS — F0390 Unspecified dementia without behavioral disturbance: Secondary | ICD-10-CM

## 2011-08-05 DIAGNOSIS — R5383 Other fatigue: Secondary | ICD-10-CM

## 2011-08-05 DIAGNOSIS — E782 Mixed hyperlipidemia: Secondary | ICD-10-CM

## 2011-08-05 DIAGNOSIS — E876 Hypokalemia: Secondary | ICD-10-CM

## 2011-08-05 LAB — CBC
Hemoglobin: 10.7 g/dL — ABNORMAL LOW (ref 12.0–15.0)
MCH: 27.2 pg (ref 26.0–34.0)
MCHC: 32.2 g/dL (ref 30.0–36.0)
Platelets: 180 10*3/uL (ref 150–400)
RDW: 15 % (ref 11.5–15.5)

## 2011-08-05 LAB — BASIC METABOLIC PANEL
Calcium: 8.5 mg/dL (ref 8.4–10.5)
GFR calc Af Amer: 88 mL/min — ABNORMAL LOW (ref >90)
GFR calc non Af Amer: 76 mL/min — ABNORMAL LOW (ref >90)
Glucose, Bld: 78 mg/dL (ref 70–99)
Potassium: 3 mEq/L — ABNORMAL LOW (ref 3.5–5.1)
Sodium: 140 mEq/L (ref 135–145)

## 2011-08-05 LAB — URINE CULTURE

## 2011-08-05 LAB — MAGNESIUM: Magnesium: 1.5 mg/dL (ref 1.5–2.5)

## 2011-08-05 MED ORDER — MAGNESIUM SULFATE IN D5W 10-5 MG/ML-% IV SOLN
1.0000 g | Freq: Once | INTRAVENOUS | Status: AC
  Administered 2011-08-05: 1 g via INTRAVENOUS
  Filled 2011-08-05: qty 100

## 2011-08-05 MED ORDER — POTASSIUM CHLORIDE CRYS ER 20 MEQ PO TBCR
40.0000 meq | EXTENDED_RELEASE_TABLET | Freq: Two times a day (BID) | ORAL | Status: AC
  Administered 2011-08-05 (×2): 40 meq via ORAL
  Filled 2011-08-05 (×2): qty 2

## 2011-08-05 MED ORDER — CEPHALEXIN 500 MG PO CAPS
500.0000 mg | ORAL_CAPSULE | Freq: Two times a day (BID) | ORAL | Status: DC
  Administered 2011-08-05: 500 mg via ORAL
  Filled 2011-08-05 (×2): qty 1

## 2011-08-05 MED ORDER — BOOST / RESOURCE BREEZE PO LIQD: 1.0000 | Freq: Two times a day (BID) | ORAL | Status: DC

## 2011-08-05 MED ORDER — POTASSIUM CHLORIDE 20 MEQ PO PACK
40.0000 meq | PACK | Freq: Two times a day (BID) | ORAL | Status: DC
  Filled 2011-08-05 (×2): qty 2

## 2011-08-05 MED ORDER — POTASSIUM CHLORIDE 10 MEQ/100ML IV SOLN
10.0000 meq | INTRAVENOUS | Status: AC
  Administered 2011-08-05 (×2): 10 meq via INTRAVENOUS
  Filled 2011-08-05 (×2): qty 100

## 2011-08-05 MED ORDER — CEPHALEXIN 500 MG PO CAPS: 500.0000 mg | ORAL_CAPSULE | Freq: Two times a day (BID) | ORAL | Status: DC

## 2011-08-05 NOTE — Discharge Summary (Signed)
Discharge Summary  [facility] MR#: [id number]  DOB:February 20, 1918  Date of Admission: 08/03/2011 Date of Discharge: 08/05/2011  Patient's PCP: Rogelia Boga, MD  Attending Physician:Darrow Barreiro   Discharge Diagnoses: Active Problems:  UTI (lower urinary tract infection)  Dementia  Lethargy  Decreased oral intake  Hypokalemia  Leukocytosis   Brief Admitting History and Physical [name] is a 78 y.o. AA female who was brought in by family because they suspected UTI. For the past few days patient has had decreased PO intake and been lethargic (different from usual). Daughter has been living with patient for the last year. Patient does not walk. Patient is demented so most history is obtained from family. Code status was discussed and family and patient wish to be a DNR/DNI.  In ER/EMS was given IVF for hypotension.    Discharge Medications Medication List  As of 08/05/2011  2:44 PM   STOP taking these medications         valsartan-hydrochlorothiazide 160-12.5 MG per tablet         TAKE these medications         cephALEXin 500 MG capsule   Commonly known as: KEFLEX   Take 1 capsule (500 mg total) by mouth every 12 (twelve) hours.      feeding supplement Liqd   Take 1 Container by mouth 2 (two) times daily between meals.            Hospital Course: 1. UTI ecoli- rocephin day #3-change to PO keflex at discharge 2. Dementia- baseline per family- takes care of her 24 hours/day 3. Lethargy- patient seems to be more awake today 4. hypokalemia- replace 5. Decreased oral intake- encourage eating, nutrition consulted- supplementation given 6. Leukocytosis- monitor 7. Not ambulatory at baseline- secondary to dementia/deconditioning     Day of Discharge BP 110/67  Pulse 70  Temp 98.5 F (36.9 C) (Oral)  Resp 18  Ht 5\' 5"  (1.651 m)  Wt 48.8 kg (107 lb 9.4 oz)  BMI 17.90 kg/m2  SpO2 99% Pleasant and cooperative in bed NAD -c/c/e +BS soft,  NT  Results for orders placed during the hospital encounter of 08/03/11 (from the past 48 hour(s))  BASIC METABOLIC PANEL     Status: Abnormal   Collection Time   08/04/11  5:05 AM      Component Value Range Comment   Sodium 142  135 - 145 mEq/L    Potassium 3.0 (*) 3.5 - 5.1 mEq/L    Chloride 110  96 - 112 mEq/L    CO2 24  19 - 32 mEq/L    Glucose, Bld 86  70 - 99 mg/dL    BUN 24 (*) 6 - 23 mg/dL    Creatinine, Ser 5.62  0.50 - 1.10 mg/dL    Calcium 9.1  8.4 - 13.0 mg/dL    GFR calc non Af Amer 71 (*) >90 mL/min    GFR calc Af Amer 83 (*) >90 mL/min   CBC     Status: Abnormal   Collection Time   08/04/11  5:05 AM      Component Value Range Comment   WBC 16.9 (*) 4.0 - 10.5 K/uL    RBC 3.93  3.87 - 5.11 MIL/uL    Hemoglobin 11.2 (*) 12.0 - 15.0 g/dL    HCT 86.5 (*) 78.4 - 46.0 %    MCV 84.5  78.0 - 100.0 fL    MCH 28.5  26.0 - 34.0 pg    MCHC 33.7  30.0 - 36.0 g/dL    RDW 40.9  81.1 - 91.4 %    Platelets 182  150 - 400 K/uL   CBC     Status: Abnormal   Collection Time   08/05/11  4:45 AM      Component Value Range Comment   WBC 15.2 (*) 4.0 - 10.5 K/uL    RBC 3.93  3.87 - 5.11 MIL/uL    Hemoglobin 10.7 (*) 12.0 - 15.0 g/dL    HCT 78.2 (*) 95.6 - 46.0 %    MCV 84.5  78.0 - 100.0 fL    MCH 27.2  26.0 - 34.0 pg    MCHC 32.2  30.0 - 36.0 g/dL    RDW 21.3  08.6 - 57.8 %    Platelets 180  150 - 400 K/uL   BASIC METABOLIC PANEL     Status: Abnormal   Collection Time   08/05/11  4:45 AM      Component Value Range Comment   Sodium 140  135 - 145 mEq/L    Potassium 3.0 (*) 3.5 - 5.1 mEq/L    Chloride 112  96 - 112 mEq/L    CO2 22  19 - 32 mEq/L    Glucose, Bld 78  70 - 99 mg/dL    BUN 21  6 - 23 mg/dL    Creatinine, Ser 4.69  0.50 - 1.10 mg/dL    Calcium 8.5  8.4 - 62.9 mg/dL    GFR calc non Af Amer 76 (*) >90 mL/min    GFR calc Af Amer 88 (*) >90 mL/min   MAGNESIUM     Status: Normal   Collection Time   08/05/11  4:45 AM      Component Value Range Comment   Magnesium  1.5  1.5 - 2.5 mg/dL     Dg Chest Port 1 View  08/03/2011  *RADIOLOGY REPORT*  Clinical Data: Fever and weakness.  PORTABLE CHEST - 1 VIEW  Comparison: Chest x-ray 01/21/2005.  Findings: Assessment of the lung apices and upper mediastinum is limited by patient's kyphotic positioning.  Within the well visualized portions of the thorax there is no definite acute consolidative airspace disease, and no definite pleural effusions. Pulmonary vasculature appears normal.  Heart size is mildly enlarged (unchanged).  Atherosclerotic calcifications within the arch of the aorta.  Right-sided pacemaker device in place with the tip projecting over the expected location of the right ventricular apex.  IMPRESSION: 1.  No definite radiographic evidence of acute cardiopulmonary disease. 2.  Mild cardiomegaly. 3.  Atherosclerosis.  Original Report Authenticated By: Florencia Reasons, M.D.     Disposition: home with family  Diet: regular  Activity: as tolerated   Follow-up Appts: Discharge Orders    Future Orders Please Complete By Expires   Diet - low sodium heart healthy      Increase activity slowly      Discharge instructions      Comments:   With family 24/hour care        Time spent on discharge, talking to the patient, and coordinating care: 34 mins.   SignedMarlin Canary, DO 08/05/2011, 2:44 PM

## 2011-08-08 ENCOUNTER — Telehealth: Payer: Self-pay | Admitting: Internal Medicine

## 2011-08-08 NOTE — Telephone Encounter (Signed)
Caller: Gloria/Child; PCP: Eleonore Chiquito; CB#: [phone];  Call regarding Hospitalized From 6/26-6/29 for weakness and grunting. Dx UTI- Needed K+ and Advised Not Give BP Med (Diovan HCTZ)d/t low BP. Calling to make f/u appnt at office. Today she is doing better-08/08/11. Short of breath with activities, Afebrile. She is eating and drinking better- started on Nurtritional supplement drink. Discharge instructions from Hospital mentioned possibly needing O2 at home. Triage and care advice per Breathing Difficulty Protocol and advised to be checked within 24 hours for "gradual onset of breathing problems occur when lying down or that limit normal activity." [name] will call back on 08/09/11 to schedule appnt after arranging for help and ride to bring in mom.

## 2011-08-10 ENCOUNTER — Ambulatory Visit: Payer: Self-pay | Admitting: Internal Medicine

## 2011-08-12 ENCOUNTER — Encounter (HOSPITAL_COMMUNITY): Payer: Self-pay

## 2011-08-12 ENCOUNTER — Ambulatory Visit (INDEPENDENT_AMBULATORY_CARE_PROVIDER_SITE_OTHER): Payer: Medicare Other | Admitting: Internal Medicine

## 2011-08-12 ENCOUNTER — Encounter: Payer: Self-pay | Admitting: Internal Medicine

## 2011-08-12 ENCOUNTER — Inpatient Hospital Stay (HOSPITAL_COMMUNITY)
Admission: AD | Admit: 2011-08-12 | Discharge: 2011-08-19 | DRG: 299 | Disposition: A | Discharge status: Home Health | Payer: Medicare Other | Source: Ambulatory Visit | Attending: Internal Medicine | Admitting: Internal Medicine

## 2011-08-12 VITALS — BP 120/80 | Temp 98.3°F

## 2011-08-12 DIAGNOSIS — I10 Essential (primary) hypertension: Secondary | ICD-10-CM

## 2011-08-12 DIAGNOSIS — F0390 Unspecified dementia without behavioral disturbance: Secondary | ICD-10-CM

## 2011-08-12 DIAGNOSIS — N390 Urinary tract infection, site not specified: Secondary | ICD-10-CM

## 2011-08-12 DIAGNOSIS — Z66 Do not resuscitate: Secondary | ICD-10-CM | POA: Diagnosis present

## 2011-08-12 DIAGNOSIS — Z681 Body mass index (BMI) 19 or less, adult: Secondary | ICD-10-CM

## 2011-08-12 DIAGNOSIS — E876 Hypokalemia: Secondary | ICD-10-CM

## 2011-08-12 DIAGNOSIS — R5383 Other fatigue: Secondary | ICD-10-CM

## 2011-08-12 DIAGNOSIS — R532 Functional quadriplegia: Secondary | ICD-10-CM | POA: Diagnosis present

## 2011-08-12 DIAGNOSIS — I82409 Acute embolism and thrombosis of unspecified deep veins of unspecified lower extremity: Secondary | ICD-10-CM

## 2011-08-12 DIAGNOSIS — D638 Anemia in other chronic diseases classified elsewhere: Secondary | ICD-10-CM | POA: Diagnosis present

## 2011-08-12 DIAGNOSIS — K2901 Acute gastritis with bleeding: Secondary | ICD-10-CM

## 2011-08-12 DIAGNOSIS — R638 Other symptoms and signs concerning food and fluid intake: Secondary | ICD-10-CM

## 2011-08-12 DIAGNOSIS — I872 Venous insufficiency (chronic) (peripheral): Secondary | ICD-10-CM

## 2011-08-12 DIAGNOSIS — Z993 Dependence on wheelchair: Secondary | ICD-10-CM

## 2011-08-12 DIAGNOSIS — D649 Anemia, unspecified: Secondary | ICD-10-CM | POA: Diagnosis present

## 2011-08-12 DIAGNOSIS — R2689 Other abnormalities of gait and mobility: Secondary | ICD-10-CM | POA: Diagnosis present

## 2011-08-12 DIAGNOSIS — Z950 Presence of cardiac pacemaker: Secondary | ICD-10-CM

## 2011-08-12 DIAGNOSIS — R636 Underweight: Secondary | ICD-10-CM | POA: Diagnosis present

## 2011-08-12 DIAGNOSIS — D72829 Elevated white blood cell count, unspecified: Secondary | ICD-10-CM

## 2011-08-12 DIAGNOSIS — I82629 Acute embolism and thrombosis of deep veins of unspecified upper extremity: Principal | ICD-10-CM | POA: Diagnosis present

## 2011-08-12 DIAGNOSIS — E440 Moderate protein-calorie malnutrition: Secondary | ICD-10-CM | POA: Diagnosis present

## 2011-08-12 LAB — DIFFERENTIAL
Basophils Relative: 0 % (ref 0–1)
Eosinophils Absolute: 0.1 10*3/uL (ref 0.0–0.7)
Eosinophils Relative: 1 % (ref 0–5)
Monocytes Absolute: 0.9 10*3/uL (ref 0.1–1.0)
Monocytes Relative: 6 % (ref 3–12)

## 2011-08-12 LAB — COMPREHENSIVE METABOLIC PANEL
ALT: 6 U/L (ref 0–35)
AST: 13 U/L (ref 0–37)
CO2: 25 mEq/L (ref 19–32)
Chloride: 108 mEq/L (ref 96–112)
GFR calc Af Amer: 89 mL/min — ABNORMAL LOW (ref >90)
GFR calc non Af Amer: 77 mL/min — ABNORMAL LOW (ref >90)
Glucose, Bld: 96 mg/dL (ref 70–99)
Sodium: 142 mEq/L (ref 135–145)
Total Bilirubin: 0.4 mg/dL (ref 0.3–1.2)

## 2011-08-12 LAB — CBC
HCT: 33.2 % — ABNORMAL LOW (ref 36.0–46.0)
Hemoglobin: 10.9 g/dL — ABNORMAL LOW (ref 12.0–15.0)
MCH: 27 pg (ref 26.0–34.0)
MCHC: 32.8 g/dL (ref 30.0–36.0)
MCV: 82.4 fL (ref 78.0–100.0)
RDW: 14.9 % (ref 11.5–15.5)

## 2011-08-12 MED ORDER — ONDANSETRON HCL 4 MG/2ML IJ SOLN: 4.0000 mg | Freq: Four times a day (QID) | INTRAMUSCULAR | Status: DC | PRN

## 2011-08-12 MED ORDER — POLYETHYLENE GLYCOL 3350 17 G PO PACK
17.0000 g | PACK | Freq: Every day | ORAL | Status: DC | PRN
  Filled 2011-08-12: qty 1

## 2011-08-12 MED ORDER — BOOST / RESOURCE BREEZE PO LIQD
1.0000 | Freq: Two times a day (BID) | ORAL | Status: DC
  Administered 2011-08-12 – 2011-08-13 (×3): 1 via ORAL

## 2011-08-12 MED ORDER — SODIUM CHLORIDE 0.9 % IJ SOLN: 3.0000 mL | INTRAMUSCULAR | Status: DC | PRN

## 2011-08-12 MED ORDER — ACETAMINOPHEN 325 MG PO TABS: 650.0000 mg | ORAL_TABLET | Freq: Four times a day (QID) | ORAL | Status: DC | PRN

## 2011-08-12 MED ORDER — ONDANSETRON HCL 4 MG PO TABS: 4.0000 mg | ORAL_TABLET | Freq: Four times a day (QID) | ORAL | Status: DC | PRN

## 2011-08-12 MED ORDER — SODIUM CHLORIDE 0.9 % IJ SOLN
3.0000 mL | Freq: Two times a day (BID) | INTRAMUSCULAR | Status: DC
  Administered 2011-08-13 – 2011-08-16 (×8): 3 mL via INTRAVENOUS
  Administered 2011-08-17: 09:00:00 via INTRAVENOUS

## 2011-08-12 MED ORDER — ENOXAPARIN SODIUM 60 MG/0.6ML ~~LOC~~ SOLN
50.0000 mg | Freq: Two times a day (BID) | SUBCUTANEOUS | Status: AC
  Administered 2011-08-12 – 2011-08-17 (×11): 50 mg via SUBCUTANEOUS
  Filled 2011-08-12 (×12): qty 0.6

## 2011-08-12 MED ORDER — ALUM & MAG HYDROXIDE-SIMETH 200-200-20 MG/5ML PO SUSP
30.0000 mL | Freq: Four times a day (QID) | ORAL | Status: DC | PRN
Start: 2011-08-12 — End: 2011-08-19

## 2011-08-12 MED ORDER — ACETAMINOPHEN 650 MG RE SUPP: 650.0000 mg | Freq: Four times a day (QID) | RECTAL | Status: DC | PRN

## 2011-08-12 MED ORDER — ENOXAPARIN SODIUM 40 MG/0.4ML ~~LOC~~ SOLN: 40.0000 mg | SUBCUTANEOUS | Status: DC

## 2011-08-12 MED ORDER — SODIUM CHLORIDE 0.9 % IV SOLN: 250.0000 mL | INTRAVENOUS | Status: DC | PRN

## 2011-08-12 MED ORDER — HYDROCHLOROTHIAZIDE 12.5 MG PO CAPS
12.5000 mg | ORAL_CAPSULE | Freq: Every day | ORAL | Status: DC
  Administered 2011-08-12 – 2011-08-19 (×8): 12.5 mg via ORAL
  Filled 2011-08-12 (×8): qty 1

## 2011-08-12 NOTE — Patient Instructions (Signed)
Report to [facility] immediately  for hospital admission

## 2011-08-12 NOTE — H&P (Signed)
Hospital Admission Note Date: 08/12/2011  Patient name: [name] Medical record number: [id number] Date of birth: February 16, 1918 Age: 78 y.o. Gender: female PCP: Rogelia Boga, MD  Attending physician: Maryruth Bun Rama, MD Emergency Contact: Rinaldo Cloud (daughter); [phone]  Code Status:  DNR  Chief Complaint: Right hand and arm swelling.  History of Present Illness: [name] is an 78 y.o. female with a PMH of dementia, prior wrist fractures, recent hospitalization from 08/03/11-08/05/11 for treatment of mental status changes thought to be secondary to a E. Coli UTI (completed a course of Cephalexin) and electrolyte abnormalities.  She initially did well post discharge, but noticed swelling and pain in the right hand and forearm.  She normally keeps her lower arm in an ACE wrap and asked her family to remove it, due to discomfort.  Her daughter, [name], brought her to see Dr. Lesia Hausen today for hospital follow up, and he referred her as a direct admission to have her evaluated for a possible RUE DVT. The patient currently denies pain in the right hand/forearm.  She denies having an IV line in the right arm during her previous hospital stay.    Past Medical History Past Medical History  Diagnosis Date  . High cholesterol   . Heart block   . Ulcer     chronic right leg status ulcer.   Marland Kitchen HTN (hypertension)     Past Surgical History Past Surgical History  Procedure Date  . Permanent pacemaker     Meds: Prior to Admission medications   Medication Sig Start Date End Date Taking? Authorizing Provider  cephALEXin (KEFLEX) 500 MG capsule Take 500 mg by mouth 4 (four) times daily.   Yes Historical Provider, MD  feeding supplement (RESOURCE BREEZE) LIQD Take 1 Container by mouth 2 (two) times daily between meals. 08/05/11  Yes Joseph Art, DO  hydrochlorothiazide (MICROZIDE) 12.5 MG capsule Take 12.5 mg by mouth daily.   Yes Historical Provider, MD     Allergies: Review of patient's allergies indicates no known allergies.  Social History: History   Social History  . Marital Status: Widowed    Spouse Name: N/A    Number of Children: 14 living, 1 deceased  . Years of Education: N/A   Occupational History  . Homemaker   Social History Main Topics  . Smoking status: Never Smoker   . Smokeless tobacco: Current User    Types: Snuff  . Alcohol Use: No  . Drug Use: No  . Sexually Active: No   Other Topics Concern  . Not on file   Social History Narrative   Lives with daughter, wheelchair bound, widowed.    Family History:  Family History  Problem Relation Age of Onset  . Cancer Son   . Heart failure Daughter     Review of Systems: Constitutional: No fever, no chills;  Appetite diminished, but improving; No weight loss, no weight gain, + fatigue.  HEENT: No blurry vision, no diplopia, no pharyngitis, no dysphagia CV: No chest pain, no palpitations.  Resp: No SOB, no cough. GI: No nausea, no vomiting, no diarrhea, no melena, no hematochezia.  GU: No dysuria, no hematuria.  MSK: no myalgias, no arthralgias/ + gait imbalance.  Neuro:  No headache, no focal neurological deficits, no history of seizures, +Confusion.  Psych: No depression, no anxiety.  Endo: No thyroid disease, no DM, no heat intolerance, no cold intolerance, no polyuria, no polydipsia  Skin: No rashes, no skin lesions.  Heme: No easy bruising, no  history of blood diseases.   Physical Exam: Blood pressure 117/62, pulse 67, temperature 98.2 F (36.8 C), temperature source Oral, resp. rate 20, height 5\' 5"  (1.651 m), weight 48.535 kg (107 lb), SpO2 99.00%. BP 117/62  Pulse 67  Temp 98.2 F (36.8 C) (Oral)  Resp 20  Ht 5\' 5"  (1.651 m)  Wt 48.535 kg (107 lb)  BMI 17.81 kg/m2  SpO2 99%  General Appearance:    Elderly female, appears stated age, in no distress  Head:    Normocephalic, without obvious abnormality, atraumatic  Eyes:    PERRL,  conjunctiva/corneas clear, EOM's intact, +arcus senilis  Ears:    Normal external ear canals, both ears  Nose:   Nares normal, septum midline, mucosa normal, no drainage    or sinus tenderness  Throat:   Lips, mucosa, and tongue normal, moist.  Neck:   Supple, symmetrical, trachea midline, no adenopathy;    thyroid:  no enlargement/tenderness/nodules; no carotid   bruit or JVD  Back:     Symmetric, no curvature, ROM normal, no CVA tenderness  Lungs:     Lungs diminished at the bases, respirations unlabored  Chest Wall:    No tenderness or deformity   Heart:    Regular rate and rhythm, S1 and S2 normal, no murmur, rub   or gallop  Abdomen:     Soft, non-tender, bowel sounds active all four quadrants,    no masses, no organomegaly  Extremities:   RUE massively swollen from hand to shoulder  Pulses:   1+ and symmetric all extremities  Skin:   Skin color, texture, turgor normal, no rashes or lesions  Lymph nodes:   Cervical, supraclavicular, and axillary nodes normal  Neurologic:   CNII-XII intact, generalized weakness   Lab results: No labs yet.  Microbiology Recent Results (from the past 240 hour(s))  URINE CULTURE     Status: Normal   Collection Time   08/03/11 12:44 PM      Component Value Range Status Comment   Specimen Description URINE, CATHETERIZED   Final    Special Requests NONE   Final    Culture  Setup Time 08/03/2011 17:31   Final    Colony Count >=100,000 COLONIES/ML   Final    Culture ESCHERICHIA COLI   Final    Report Status 08/05/2011 FINAL   Final    Organism ID, Bacteria ESCHERICHIA COLI   Final     Imaging results:  No results found.  Assessment & Plan: Principal Problem:  *Probable DVT (deep venous thrombosis) of the RUE  RUE Doppler to evaluate for DVT.  Start Lovenox at therapeutic dose due to high probability of DVT. Active Problems:  HYPERTENSION  Continue HCTZ.  Dementia  Stable.  Wheelchair bound at baseline.  Prophylaxis: Therapeutic dose  Lovenox pending Dopplers.  Time Spent On Admission: 45 minutes.  RAMA,CHRISTINA 08/12/2011, 3:55 PM Pager [phone]

## 2011-08-12 NOTE — Progress Notes (Signed)
Subjective:    Patient ID: [place], female    DOB: Oct 26, 1918, 78 y.o.   MRN: [id number]  HPI  78 year old patient who is seen today post hospital discharge.  She was admitted for evaluation and treatment of acute mental status changes in the setting of chronic dementia UTI and electrolyte abnormalities. Since her discharge she has continued to improve with improved intake and general status. She is nonambulatory; she is eating and drinking well and is close to being back to baseline. She is accompanied today by family members who noted the onset of right arm swelling yesterday. Otherwise her clinical status has been stable. She has completed antibiotic treatment with cephalexin Hospital records reviewed  Past Medical History  Diagnosis Date  . High cholesterol   . Heart block   . Ulcer     chronic right leg status ulcer.   Marland Kitchen HTN (hypertension)     History   Social History  . Marital Status: Single    Spouse Name: N/A    Number of Children: N/A  . Years of Education: N/A   Occupational History  . Not on file.   Social History Main Topics  . Smoking status: Never Smoker   . Smokeless tobacco: Current User    Types: Snuff  . Alcohol Use: No  . Drug Use: No  . Sexually Active: No   Other Topics Concern  . Not on file   Social History Narrative   Retired, single.     Past Surgical History  Procedure Date  . Permanent pacemaker     No family history on file.  No Known Allergies  Current Outpatient Prescriptions on File Prior to Visit  Medication Sig Dispense Refill  . feeding supplement (RESOURCE BREEZE) LIQD Take 1 Container by mouth 2 (two) times daily between meals.        BP 120/80  Temp 98.3 F (36.8 C) (Oral)       Review of Systems  Constitutional: Positive for activity change, appetite change and fatigue.  HENT: Negative for hearing loss, congestion, sore throat, rhinorrhea, dental problem, sinus pressure and tinnitus.   Eyes: Negative  for pain, discharge and visual disturbance.  Respiratory: Negative for cough and shortness of breath.   Cardiovascular: Positive for leg swelling (Chronic right lower extremity edema;  acute painless swelling of the right arm). Negative for chest pain and palpitations.  Gastrointestinal: Negative for nausea, vomiting, abdominal pain, diarrhea, constipation, blood in stool and abdominal distention.  Genitourinary: Negative for dysuria, urgency, frequency, hematuria, flank pain, vaginal bleeding, vaginal discharge, difficulty urinating, vaginal pain and pelvic pain.  Musculoskeletal: Positive for gait problem (wheelchair-bound). Negative for joint swelling and arthralgias.  Skin: Negative for rash.  Neurological: Negative for dizziness, syncope, speech difficulty, weakness, numbness and headaches.  Hematological: Negative for adenopathy.  Psychiatric/Behavioral: Positive for confusion. Negative for behavioral problems, dysphoric mood and agitation. The patient is not nervous/anxious.        Objective:   Physical Exam  Constitutional: She is oriented to person, place, and time. She appears well-developed and well-nourished.       Wheelchair-bound Afebrile Blood pressure stable O2 saturation 96%  HENT:  Head: Normocephalic.  Right Ear: External ear normal.  Left Ear: External ear normal.       Mucosal membranes appeared well-hydrated  Eyes: Conjunctivae and EOM are normal. Pupils are equal, round, and reactive to light.  Neck: Normal range of motion. No thyromegaly present.       Decreased range of  motion of the neck Kyphosis  Cardiovascular: Normal rate, regular rhythm, normal heart sounds and intact distal pulses.   Pulmonary/Chest: Effort normal and breath sounds normal.  Abdominal: Soft. Bowel sounds are normal. She exhibits no mass. There is no tenderness.  Musculoskeletal: Normal range of motion. She exhibits edema.       Right distal lower extremity with chronic edema; bandage in  place  Significant edemayous changes of the entire right arm Right axilla unremarkable  Lymphadenopathy:    She has no cervical adenopathy.  Neurological: She is alert and oriented to person, place, and time.  Skin: Skin is warm and dry. No rash noted.  Psychiatric:       Dementia          Assessment & Plan:   Acute right arm edema. Probable subclavian or axillary vein DVT.  Best option would be to readmit to the hospital for further evaluation and treatment

## 2011-08-12 NOTE — Progress Notes (Signed)
ANTICOAGULATION CONSULT NOTE - Initial Consult  Pharmacy Consult for Lovenox Indication: VTE treatment  No Known Allergies  Patient Measurements: Height: 5\' 5"  (165.1 cm) Weight: 107 lb (48.535 kg) IBW/kg (Calculated) : 57  Heparin Dosing Weight:   Vital Signs: Temp: 98.2 F (36.8 C) (07/05 1449) Temp src: Oral (07/05 1449) BP: 117/62 mmHg (07/05 1449) Pulse Rate: 67  (07/05 1449)  Labs: No results found for this basename: HGB:2,HCT:3,PLT:3,APTT:3,LABPROT:3,INR:3,HEPARINUNFRC:3,CREATININE:3,CKTOTAL:3,CKMB:3,TROPONINI:3 in the last 72 hours  Estimated Creatinine Clearance: 35.8 ml/min (by C-G formula based on Cr of 0.65).   Medical History: Past Medical History  Diagnosis Date  . High cholesterol   . Heart block   . Ulcer     chronic right leg status ulcer.   Marland Kitchen HTN (hypertension)     Medications:  Scheduled:    . feeding supplement  1 Container Oral BID BM  . hydrochlorothiazide  12.5 mg Oral Daily  . sodium chloride  3 mL Intravenous Q12H  . DISCONTD: enoxaparin (LOVENOX) injection  40 mg Subcutaneous Q24H   Infusions:   PRN: sodium chloride, acetaminophen, acetaminophen, alum & mag hydroxide-simeth, ondansetron (ZOFRAN) IV, ondansetron, polyethylene glycol, sodium chloride  Assessment: 78 yo female admitted with right hand and forearm pain/swelling after recent hospitalization ending 08/05/11. Now for VTE treatment. Goal of Therapy:  Anti-Xa level 0.6-1.2 units/ml 4hrs after LMWH dose given    Plan:   Lovenox 50mg  sq q 12hours   Follow up on CrCl   Loletta Specter 08/12/2011,4:21 PM

## 2011-08-13 DIAGNOSIS — M7989 Other specified soft tissue disorders: Secondary | ICD-10-CM

## 2011-08-13 DIAGNOSIS — R2689 Other abnormalities of gait and mobility: Secondary | ICD-10-CM | POA: Diagnosis present

## 2011-08-13 DIAGNOSIS — I82409 Acute embolism and thrombosis of unspecified deep veins of unspecified lower extremity: Secondary | ICD-10-CM

## 2011-08-13 DIAGNOSIS — D649 Anemia, unspecified: Secondary | ICD-10-CM | POA: Diagnosis present

## 2011-08-13 MED ORDER — WARFARIN - PHARMACIST DOSING INPATIENT: Freq: Every day | Status: DC

## 2011-08-13 MED ORDER — WARFARIN SODIUM 3 MG PO TABS
3.0000 mg | ORAL_TABLET | Freq: Once | ORAL | Status: AC
  Administered 2011-08-13: 3 mg via ORAL
  Filled 2011-08-13: qty 1

## 2011-08-13 NOTE — Progress Notes (Signed)
ANTICOAGULATION CONSULT NOTE - Initial Consult  Pharmacy Consult for Lovenox & Coumadin Indication: DVT  No Known Allergies  Patient Measurements: Height: 5\' 5"  (165.1 cm) Weight: 107 lb (48.535 kg) IBW/kg (Calculated) : 57   Vital Signs: Temp: 98.3 F (36.8 C) (07/06 0651) Temp src: Oral (07/06 0651) BP: 117/55 mmHg (07/06 0651) Pulse Rate: 69  (07/06 0651)  Labs:  Basename 08/12/11 1615 08/12/11 1614  HGB 10.9* --  HCT 33.2* --  PLT 232 --  APTT -- 34  LABPROT -- 15.9*  INR -- 1.24  HEPARINUNFRC -- --  CREATININE -- 0.63  CKTOTAL -- --  CKMB -- --  TROPONINI -- --    Estimated Creatinine Clearance: 35.8 ml/min (by C-G formula based on Cr of 0.63).   Medical History: Past Medical History  Diagnosis Date  . High cholesterol   . Heart block   . Ulcer     chronic right leg status ulcer.   Marland Kitchen HTN (hypertension)    Medications:  Scheduled:    . enoxaparin (LOVENOX) injection  50 mg Subcutaneous Q12H  . feeding supplement  1 Container Oral BID BM  . hydrochlorothiazide  12.5 mg Oral Daily  . sodium chloride  3 mL Intravenous Q12H  . DISCONTD: enoxaparin (LOVENOX) injection  40 mg Subcutaneous Q24H    Assessment:  78yo F admitted with right hand and forearm pain/swelling after recent hospitalization ending 08/05/11. Dopplers confirmed RUE DVT.  Day #2 Lovenox. Day#1 Lovenox/Coumadin overlap.  Likely to be sensitive to Coumadin d/t advanced age and low weight. Also, baseline INR was slightly elevated.  Goal of Therapy:  INR 2-3 Monitor platelets by anticoagulation protocol: Yes   Plan:   Cont Lovenox 1mg /kg SQ q12h.  Give Coumadin 3mg  today.  F/u daily INR and CBC.  Coumadin education.  Charolotte Eke, PharmD, pager [phone]. 08/13/2011,12:14 PM.

## 2011-08-13 NOTE — Progress Notes (Signed)
PROGRESS NOTE  [name]:[id number] DOB: 01-08-1919 DOA: 08/12/2011 PCP: Rogelia Boga, MD  Brief narrative: [name] is an 78 y.o. female with a PMH of dementia, prior wrist fractures, recent hospitalization from 08/03/11-08/05/11 for treatment of mental status changes thought to be secondary to a E. Coli UTI (completed a course of Cephalexin) and electrolyte abnormalities who was re-admitted 08/12/11 with suspected RUE DVT.   Interim History: Stable overnight.  Awaiting Doppler studies.   Assessment/Plan: Principal Problem:  *Probable DVT (deep venous thrombosis) of the RUE   RUE Doppler to evaluate for DVT not done yet.   Continue Lovenox at therapeutic dose due to high probability of DVT. Active Problems:  Decreased mobility  High risk for skin breakdown.  Air mattress overlay.  Heel protectors. HYPERTENSION   Continue HCTZ. Dementia   Stable. Wheelchair bound at baseline.  Normocytic anemia  Likely AOCD in this 78 year old female.  Mild.  Code Status: DNR Family Communication: Daughter updated at bedside. Disposition Plan: Home when stable.  Medical Consultants:  None  Other consultants:  None  Antibiotics:  None   Subjective  [name] is resting comfortably.  No RUE pain.  No dyspnea.     Objective   Objective: Filed Vitals:   08/12/11 1449 08/12/11 2154 08/13/11 0651  BP: 117/62 125/77 117/55  Pulse: 67 72 69  Temp: 98.2 F (36.8 C) 98.8 F (37.1 C) 98.3 F (36.8 C)  TempSrc: Oral Oral Oral  Resp: 20 18 16   Height: 5\' 5"  (1.651 m)    Weight: 48.535 kg (107 lb)    SpO2: 99% 98% 98%    Intake/Output Summary (Last 24 hours) at 08/13/11 0751 Last data filed at 08/12/11 1742  Gross per 24 hour  Intake    180 ml  Output      0 ml  Net    180 ml    Exam: Gen:  NAD Cardiovascular:  RRR, No M/R/G Respiratory: Lungs CTAB Gastrointestinal: Abdomen soft, NT/ND with normal active bowel sounds. Extremities: RUE  3+ edema    Data Reviewed: Basic Metabolic Panel:  Lab 08/12/11 8119  NA 142  K 3.7  CL 108  CO2 25  GLUCOSE 96  BUN 14  CREATININE 0.63  CALCIUM 9.0  MG --  PHOS --   GFR Estimated Creatinine Clearance: 35.8 ml/min (by C-G formula based on Cr of 0.63). Liver Function Tests:  Lab 08/12/11 1614  AST 13  ALT 6  ALKPHOS 54  BILITOT 0.4  PROT 6.2  ALBUMIN 2.2*   Coagulation profile  Lab 08/12/11 1614  INR 1.24  PROTIME --    CBC:  Lab 08/12/11 1615  WBC 14.2*  NEUTROABS 12.1*  HGB 10.9*  HCT 33.2*  MCV 82.4  PLT 232   Microbiology Recent Results (from the past 240 hour(s))  URINE CULTURE     Status: Normal   Collection Time   08/03/11 12:44 PM      Component Value Range Status Comment   Specimen Description URINE, CATHETERIZED   Final    Special Requests NONE   Final    Culture  Setup Time 08/03/2011 17:31   Final    Colony Count >=100,000 COLONIES/ML   Final    Culture ESCHERICHIA COLI   Final    Report Status 08/05/2011 FINAL   Final    Organism ID, Bacteria ESCHERICHIA COLI   Final     Procedures and Diagnostic Studies:  Doppler studies pending.   Scheduled Meds:    .  enoxaparin (LOVENOX) injection  50 mg Subcutaneous Q12H  . feeding supplement  1 Container Oral BID BM  . hydrochlorothiazide  12.5 mg Oral Daily  . sodium chloride  3 mL Intravenous Q12H  . DISCONTD: enoxaparin (LOVENOX) injection  40 mg Subcutaneous Q24H   Continuous Infusions:     LOS: 1 day   Hillery Aldo, MD Pager [phone]  08/13/2011, 7:51 AM

## 2011-08-13 NOTE — Progress Notes (Addendum)
VASCULAR LAB PRELIMINARY  PRELIMINARY  PRELIMINARY  PRELIMINARY  Right upper extremity venous Doppler completed.    Preliminary report:  There is acute, occlusive DVT noted in the right brachial, axillary and subclavian veins of the right upper extremity.  Reagen Goates, 08/13/2011, 10:23 AM

## 2011-08-14 LAB — CBC
HCT: 32.8 % — ABNORMAL LOW (ref 36.0–46.0)
MCH: 26.8 pg (ref 26.0–34.0)
MCHC: 32.6 g/dL (ref 30.0–36.0)
MCV: 82 fL (ref 78.0–100.0)
RDW: 14.9 % (ref 11.5–15.5)
WBC: 11.4 10*3/uL — ABNORMAL HIGH (ref 4.0–10.5)

## 2011-08-14 MED ORDER — WARFARIN SODIUM 3 MG PO TABS
3.0000 mg | ORAL_TABLET | Freq: Once | ORAL | Status: AC
  Administered 2011-08-14: 3 mg via ORAL
  Filled 2011-08-14: qty 1

## 2011-08-14 NOTE — Progress Notes (Signed)
PROGRESS NOTE  [name]:[id number] DOB: 1918-03-11 DOA: 08/12/2011 PCP: Rogelia Boga, MD  Brief narrative: [name] is an 78 y.o. female with a PMH of dementia, prior wrist fractures, recent hospitalization from 08/03/11-08/05/11 for treatment of mental status changes thought to be secondary to a E. Coli UTI (completed a course of Cephalexin) and electrolyte abnormalities who was re-admitted 08/12/11 with suspected RUE DVT.   Interim History: Stable overnight.  Doppler studies confirmed RUE DVT.   Assessment/Plan: Principal Problem:  *Probable DVT (deep venous thrombosis) of the RUE   RUE Dopplers done 08/13/11, confirmed DVT.    Continue Lovenox and coumadin.  RN to teach daughter how to administer Lovenox injections. Active Problems:  Decreased mobility  High risk for skin breakdown.  Air mattress overlay.  Heel protectors. HYPERTENSION   Continue HCTZ. Dementia   Stable. Wheelchair bound at baseline.  Normocytic anemia  Likely AOCD in this 78 year old female.  Mild.  Code Status: DNR Family Communication: Daughter updated at bedside. Disposition Plan: Home when stable.  Medical Consultants:  None  Other consultants:  Pharmacy  Physical therapy  Occupational therapy  Antibiotics:  None   Subjective  [name] is feeling well.  RUE swelling is improved and she has better mobility.  No other complaints of dyspnea, cough, pain.  Eating fair.     Objective   Objective: Filed Vitals:   08/13/11 1300 08/13/11 1417 08/13/11 2039 08/14/11 0535  BP: 144/76 133/76 118/73 122/70  Pulse: 68 70 70 70  Temp: 98.2 F (36.8 C) 97.6 F (36.4 C) 98.7 F (37.1 C) 98.7 F (37.1 C)  TempSrc: Oral Oral Oral Oral  Resp: 20 18 18 16   Height:      Weight:      SpO2: 96% 96% 97% 97%    Intake/Output Summary (Last 24 hours) at 08/14/11 0829 Last data filed at 08/13/11 1300  Gross per 24 hour  Intake    360 ml  Output      0 ml  Net     360 ml    Exam: Gen:  NAD Cardiovascular:  RRR, II/VI SEM Respiratory: Lungs CTAB Gastrointestinal: Abdomen soft, NT/ND with normal active bowel sounds. Extremities: RUE 2+ edema    Data Reviewed: Basic Metabolic Panel:  Lab 08/12/11 8119  NA 142  K 3.7  CL 108  CO2 25  GLUCOSE 96  BUN 14  CREATININE 0.63  CALCIUM 9.0  MG --  PHOS --   GFR Estimated Creatinine Clearance: 35.8 ml/min (by C-G formula based on Cr of 0.63). Liver Function Tests:  Lab 08/12/11 1614  AST 13  ALT 6  ALKPHOS 54  BILITOT 0.4  PROT 6.2  ALBUMIN 2.2*   Coagulation profile  Lab 08/14/11 0435 08/12/11 1614  INR 1.30 1.24  PROTIME -- --    CBC:  Lab 08/14/11 0435 08/12/11 1615  WBC 11.4* 14.2*  NEUTROABS -- 12.1*  HGB 10.7* 10.9*  HCT 32.8* 33.2*  MCV 82.0 82.4  PLT 227 232   Microbiology No results found for this or any previous visit (from the past 240 hour(s)).  Procedures and Diagnostic Studies:  Doppler studies 08/13/11: Preliminary report: There is acute, occlusive DVT noted in the right brachial, axillary and subclavian veins of the right upper extremity.   Scheduled Meds:    . enoxaparin (LOVENOX) injection  50 mg Subcutaneous Q12H  . feeding supplement  1 Container Oral BID BM  . hydrochlorothiazide  12.5 mg Oral Daily  .  sodium chloride  3 mL Intravenous Q12H  . warfarin  3 mg Oral Once  . warfarin  3 mg Oral ONCE-1800  . Warfarin - Pharmacist Dosing Inpatient   Does not apply q1800   Continuous Infusions:     LOS: 2 days   Hillery Aldo, MD Pager [phone]  08/14/2011, 8:29 AM

## 2011-08-14 NOTE — Progress Notes (Signed)
ANTICOAGULATION CONSULT NOTE - Initial Consult  Pharmacy Consult for Lovenox & Coumadin Indication: DVT  No Known Allergies  Patient Measurements: Height: 5\' 5"  (165.1 cm) Weight: 107 lb (48.535 kg) IBW/kg (Calculated) : 57   Vital Signs: Temp: 98.7 F (37.1 C) (07/07 0535) Temp src: Oral (07/07 0535) BP: 122/70 mmHg (07/07 0535) Pulse Rate: 70  (07/07 0535)  Labs:  Basename 08/14/11 0435 08/12/11 1615 08/12/11 1614  HGB 10.7* 10.9* --  HCT 32.8* 33.2* --  PLT 227 232 --  APTT -- -- 34  LABPROT 16.4* -- 15.9*  INR 1.30 -- 1.24  HEPARINUNFRC -- -- --  CREATININE -- -- 0.63  CKTOTAL -- -- --  CKMB -- -- --  TROPONINI -- -- --    Estimated Creatinine Clearance: 35.8 ml/min (by C-G formula based on Cr of 0.63).  Medications:  Scheduled:     . enoxaparin (LOVENOX) injection  50 mg Subcutaneous Q12H  . feeding supplement  1 Container Oral BID BM  . hydrochlorothiazide  12.5 mg Oral Daily  . sodium chloride  3 mL Intravenous Q12H  . warfarin  3 mg Oral Once  . Warfarin - Pharmacist Dosing Inpatient   Does not apply q1800   Assessment:  78yo F admitted with right hand and forearm pain/swelling after recent hospitalization ending 08/05/11. Dopplers confirmed RUE DVT.  Day #3 Lovenox. Day#2 Lovenox/Coumadin overlap.  Likely to be sensitive to Coumadin d/t advanced age and low weight. Also, baseline INR was slightly elevated.  CBC ok.  Goal of Therapy:  INR 2-3 Monitor platelets by anticoagulation protocol: Yes   Plan:   Cont Lovenox 1mg /kg SQ q12h.  Repeat Coumadin 3mg  today.  F/u daily INR and CBC.  Coumadin education - patient with mild dementia so will f/u when caregiver present.  Charolotte Eke, PharmD, pager [phone]. 08/14/2011,8:18 AM.

## 2011-08-15 LAB — CBC
HCT: 32.4 % — ABNORMAL LOW (ref 36.0–46.0)
MCH: 27.3 pg (ref 26.0–34.0)
MCV: 82 fL (ref 78.0–100.0)
RDW: 14.9 % (ref 11.5–15.5)
WBC: 10 10*3/uL (ref 4.0–10.5)

## 2011-08-15 MED ORDER — WARFARIN SODIUM 3 MG PO TABS
3.0000 mg | ORAL_TABLET | Freq: Once | ORAL | Status: AC
  Administered 2011-08-15: 3 mg via ORAL
  Filled 2011-08-15: qty 1

## 2011-08-15 MED ORDER — ENSURE PUDDING PO PUDG
1.0000 | Freq: Two times a day (BID) | ORAL | Status: DC
  Administered 2011-08-16 – 2011-08-19 (×4): 1 via ORAL
  Filled 2011-08-15 (×8): qty 1

## 2011-08-15 NOTE — Care Management Note (Signed)
    Page 1 of 2   08/17/2011     12:52:06 PM   CARE MANAGEMENT NOTE 08/17/2011  Patient:  [name]   Account Number:  [id number]  Date Initiated:  08/15/2011  Documentation initiated by:  CRAFT,TERRI  Subjective/Objective Assessment:   78 yo female admitted 08/12/11 with DVT     Action/Plan:   D/C when medically stable.  Pt lives with her daughter sat home.   Anticipated DC Date:  08/18/2011   Anticipated DC Plan:  HOME W HOME HEALTH SERVICES      DC Planning Services  CM consult      [facility] Choice  HOME HEALTH   Choice offered to / List presented to:  C-4 Adult Children   DME arranged  HOSPITAL BED      DME agency  [facility]     [facility] arranged  HH-1 RN      [facility] agency  [facility]   Status of service:  Completed, signed off Medicare Important Message given?   (If response is "NO", the following Medicare IM given date fields will be blank) Date Medicare IM given:   Date Additional Medicare IM given:    Discharge Disposition:  HOME W HOME HEALTH SERVICES  Per UR Regulation:  Reviewed for med. necessity/level of care/duration of stay  If discussed at Long Length of Stay Meetings, dates discussed:    Comments:  08/17/11 [name] Amadon RN,BSN NCM [phone] [facility]([place]) CONTACTED FOR D/C HOME W/HH/DME.HHRN-INR,LAB DRAWS.  08/15/11, [name] RNC-MNN, BSN, [phone], CM received referral.  CM met with pt and her daughter to offer choice for [facility] services.  Pt and her daughter requested that I call [name] at [phone] who sits with the pt and is her daughter as well.  [name] verbally given names of HH agencies that cover [facility].  [name] states [facility] was used in the past with her father and would like to use [facility] again. [name] at [facility] contacted with order and confirmation received. Pt's daughter will be at home to assist her as needed.

## 2011-08-15 NOTE — Progress Notes (Signed)
INITIAL ADULT NUTRITION ASSESSMENT Date: 08/15/2011   Time: 4:08 PM Reason for Assessment: Consult  INTERVENTION: D/C Resource Breeze nutrition supplement per pt request and pt's refusal to drink them. Nursing to continue to encourage pt to eat more of her meals. Ensure pudding BID given with medication to improve nutrition. Will monitor intake.   ASSESSMENT: Female 78 y.o.  Dx: DVT (deep venous thrombosis)  Food/Nutrition Related Hx: Pt and family report pt eating well at home PTA, 3 meals/day. Pt denies any problems chewing or swallowing. Pt reports she does not like [employer], noted 2 Resource Breezes at bedside, untouched. Family thinks pt has lost a lot of weight recently but they are unsure how much. Pt's weight stable since admission last month. Noted pt's intake has been 50% of meals, which is improved from 25% of meals in previous admission. Pt states she has been enjoying her food.   Hx:  Past Medical History  Diagnosis Date  . High cholesterol   . Heart block   . Ulcer     chronic right leg status ulcer.   Marland Kitchen HTN (hypertension)    Related Meds:  Scheduled Meds:   . enoxaparin (LOVENOX) injection  50 mg Subcutaneous Q12H  . feeding supplement  1 Container Oral BID BM  . hydrochlorothiazide  12.5 mg Oral Daily  . sodium chloride  3 mL Intravenous Q12H  . warfarin  3 mg Oral ONCE-1800  . warfarin  3 mg Oral ONCE-1800  . Warfarin - Pharmacist Dosing Inpatient   Does not apply q1800   Continuous Infusions:  PRN Meds:.sodium chloride, acetaminophen, acetaminophen, alum & mag hydroxide-simeth, ondansetron (ZOFRAN) IV, ondansetron, polyethylene glycol, sodium chloride  Ht: 5\' 5"  (165.1 cm)  Wt: 107 lb (48.535 kg)  Ideal Wt: 125 lb % Ideal Wt: 85  Usual Wt: 110 lb per family statement on previous admission  % Usual Wt: 97   Body mass index is 17.81 kg/(m^2).   Labs:  CMP     Component Value Date/Time   NA 142 08/12/2011 1614   K 3.7 08/12/2011 1614   CL 108  08/12/2011 1614   CO2 25 08/12/2011 1614   GLUCOSE 96 08/12/2011 1614   BUN 14 08/12/2011 1614   CREATININE 0.63 08/12/2011 1614   CALCIUM 9.0 08/12/2011 1614   PROT 6.2 08/12/2011 1614   ALBUMIN 2.2* 08/12/2011 1614   AST 13 08/12/2011 1614   ALT 6 08/12/2011 1614   ALKPHOS 54 08/12/2011 1614   BILITOT 0.4 08/12/2011 1614   GFRNONAA 77* 08/12/2011 1614   GFRAA 89* 08/12/2011 1614   No intake or output data in the 24 hours ending 08/15/11 1611  Last BM - 08/09/11  Diet Order: General  Supplements/Tube Feeding: Resource Breeze BID  IVF:    Estimated Nutritional Needs:   Kcal: 1450-1700 Protein: 75-90g Fluid: 1.4-1.7L  NUTRITION DIAGNOSIS: -Inadequate oral intake (NI-2.1).  Status: Ongoing -Pt meets criteria for moderate PCM of chronic illness AEB pt with mild muscle and fat loss, especially in arms per family statement, and BMI of 17.8   RELATED TO: dementia, pt is a small eater   AS EVIDENCE BY: <75% meal intake   MONITORING/EVALUATION(Goals): Pt to consume >75% of meals.   EDUCATION NEEDS: -No education needs identified at this time   Dietitian #: [phone]  DOCUMENTATION CODES Per approved criteria  -Non-severe (moderate) malnutrition in the context of chronic illness -Underweight    Marshall Cork 08/15/2011, 4:08 PM

## 2011-08-15 NOTE — Progress Notes (Signed)
PROGRESS NOTE  [name]:[id number] DOB: 04-23-1918 DOA: 08/12/2011 PCP: Rogelia Boga, MD  Brief narrative: [name] is an 78 y.o. female with a PMH of dementia, prior wrist fractures, recent hospitalization from 08/03/11-08/05/11 for treatment of mental status changes thought to be secondary to a E. Coli UTI (completed a course of Cephalexin) and electrolyte abnormalities who was re-admitted 08/12/11 with suspected RUE DVT.   Interim History: Stable overnight.    Assessment/Plan: Principal Problem:  *Probable DVT (deep venous thrombosis) of the RUE   RUE Dopplers done 08/13/11, confirmed DVT.    Continue Lovenox and coumadin.  RN to teach daughter how to administer Lovenox injections. Active Problems:  Decreased mobility  High risk for skin breakdown.  Air mattress overlay.  Heel protectors. HYPERTENSION   Continue HCTZ. Dementia   Stable. Wheelchair bound at baseline.  Normocytic anemia  Likely AOCD in this 78 year old female.  Mild.  H&H stable.  Code Status: DNR Family Communication: Daughter updated at bedside. Disposition Plan: Home when stable.  Medical Consultants:  None  Other consultants:  Pharmacy  Physical therapy  Occupational therapy  Antibiotics:  None   Subjective  [name] is feeling well.  Swelling has improved.  Appetite good.  No pain.  No nausea or vomiting.   Objective   Objective: Filed Vitals:   08/14/11 0535 08/14/11 1428 08/14/11 2013 08/15/11 0526  BP: 122/70 135/65 129/71 110/66  Pulse: 70 80 71 70  Temp: 98.7 F (37.1 C) 98.2 F (36.8 C) 98.7 F (37.1 C) 98.6 F (37 C)  TempSrc: Oral  Oral Oral  Resp: 16 16 16 16   Height:      Weight:      SpO2: 97% 96% 98% 98%    Intake/Output Summary (Last 24 hours) at 08/15/11 0929 Last data filed at 08/14/11 1300  Gross per 24 hour  Intake    480 ml  Output      0 ml  Net    480 ml    Exam: Gen:  NAD Cardiovascular:  RRR, II/VI  SEM Respiratory: Lungs CTAB Gastrointestinal: Abdomen soft, NT/ND with normal active bowel sounds. Extremities: RUE 2+ edema    Data Reviewed: Basic Metabolic Panel:  Lab 08/12/11 8119  NA 142  K 3.7  CL 108  CO2 25  GLUCOSE 96  BUN 14  CREATININE 0.63  CALCIUM 9.0  MG --  PHOS --   GFR Estimated Creatinine Clearance: 35.8 ml/min (by C-G formula based on Cr of 0.63). Liver Function Tests:  Lab 08/12/11 1614  AST 13  ALT 6  ALKPHOS 54  BILITOT 0.4  PROT 6.2  ALBUMIN 2.2*   Coagulation profile  Lab 08/15/11 0427 08/14/11 0435 08/12/11 1614  INR 1.60* 1.30 1.24  PROTIME -- -- --    CBC:  Lab 08/15/11 0427 08/14/11 0435 08/12/11 1615  WBC 10.0 11.4* 14.2*  NEUTROABS -- -- 12.1*  HGB 10.8* 10.7* 10.9*  HCT 32.4* 32.8* 33.2*  MCV 82.0 82.0 82.4  PLT 247 227 232   Microbiology No results found for this or any previous visit (from the past 240 hour(s)).  Procedures and Diagnostic Studies:  Doppler studies 08/13/11: Preliminary report: There is acute, occlusive DVT noted in the right brachial, axillary and subclavian veins of the right upper extremity.   Scheduled Meds:    . enoxaparin (LOVENOX) injection  50 mg Subcutaneous Q12H  . feeding supplement  1 Container Oral BID BM  . hydrochlorothiazide  12.5 mg Oral Daily  .  sodium chloride  3 mL Intravenous Q12H  . warfarin  3 mg Oral ONCE-1800  . warfarin  3 mg Oral ONCE-1800  . Warfarin - Pharmacist Dosing Inpatient   Does not apply q1800   Continuous Infusions:     LOS: 3 days   Hillery Aldo, MD Pager [phone]  08/15/2011, 9:29 AM

## 2011-08-15 NOTE — Progress Notes (Signed)
UR done. 

## 2011-08-15 NOTE — Evaluation (Signed)
Occupational Therapy Evaluation Patient Details Name: [place] MRN: [id number] DOB: 29-Nov-1918 Today's Date: 08/15/2011 Time: 1191-4782 OT Time Calculation (min): 23 min  OT Assessment / Plan / Recommendation Clinical Impression  Pt is a 78 yo female who presents with a DVT. Pt appears to be functioning at baseline with regards to ADLs. No f/u OT indicated at this time.  Daughter interested in a hospital bed with an air mattress overlay.    OT Assessment  Patient does not need any further OT services    Follow Up Recommendations  No OT follow up    Barriers to Discharge      Equipment Recommendations  Hospital bed    Recommendations for Other Services    Frequency       Precautions / Restrictions Precautions Precautions: Fall   Pertinent Vitals/Pain     ADL  Toilet Transfer: +2 Total assistance Toilet Transfer: Patient Percentage: 20% Toilet Transfer Method: Stand pivot Toilet Transfer Equipment: Other (comment) (to recliner) Toileting - Clothing Manipulation and Hygiene: +2 Total assistance Toileting - Clothing Manipulation and Hygiene: Patient Percentage: 0%    OT Diagnosis:    OT Problem List:   OT Treatment Interventions:     OT Goals    Visit Information  Last OT Received On: 08/15/11 Assistance Needed: +2 PT/OT Co-Evaluation/Treatment: Yes    Subjective Data  Subjective: I would like a bed like this at home.   Prior Functioning  Home Living Lives With: Daughter Available Help at Discharge: Family;Available 24 hours/day Type of Home: Apartment Home Access: Level entry Home Layout: One level Bathroom Shower/Tub:  (bed baths) Bathroom Toilet:  (pt wears briefs.) Home Adaptive Equipment: Wheelchair - manual;Bedside commode/3-in-1 Prior Function Level of Independence: Needs assistance Needs Assistance: Dressing;Feeding;Grooming;Toileting;Meal Prep;Light Housekeeping;Gait;Transfers Dressing: Total Feeding: Total Grooming: Total Toileting:  Total Meal Prep: Total Light Housekeeping: Total Gait Assistance: Pt non-ambulatory for ~1 yr. Transfer Assistance: Pt requires total A +2 to get in and out of bed. Able to Take Stairs?: No Driving: No Vocation: Retired Musician: No difficulties    Cognition  Overall Cognitive Status: History of cognitive impairments - at baseline Arousal/Alertness: Awake/alert Orientation Level: Place;Time;Situation Behavior During Session: [facility] for tasks performed    Extremity/Trunk Assessment Right Upper Extremity Assessment RUE ROM/Strength/Tone: Unable to fully assess;Due to impaired cognition Left Upper Extremity Assessment LUE ROM/Strength/Tone: Unable to fully assess;Due to impaired cognition Right Lower Extremity Assessment RLE ROM/Strength/Tone: Deficits RLE ROM/Strength/Tone Deficits: decreased dorsiflexion. lifts leg from bed. bears weight Left Lower Extremity Assessment LLE ROM/Strength/Tone: Deficits LLE ROM/Strength/Tone Deficits: decreased  dorsiflexion, lifts legs from bed   Mobility Bed Mobility Bed Mobility: Supine to Sit Supine to Sit: 2: Max assist Transfers Sit to Stand: 1: +2 Total assist;From bed Sit to Stand: Patient Percentage: 20% Stand to Sit: 1: +2 Total assist;To bed;To chair/3-in-1 Stand to Sit: Patient Percentage: 20% Details for Transfer Assistance: pt's legs slide forward during standing attempts.   Exercise    Balance Balance Balance Assessed: Yes Static Sitting Balance Static Sitting - Balance Support: Bilateral upper extremity supported;Feet unsupported Static Sitting - Level of Assistance: 3: Mod assist  End of Session OT - End of Session Activity Tolerance: Patient tolerated treatment well Patient left: in chair;with call bell/phone within reach;with family/visitor present  GO     Leonard Hendler A OTR/L [phone] 08/15/2011, 12:33 PM

## 2011-08-15 NOTE — Clinical Documentation Improvement (Signed)
MALNUTRITION DOCUMENTATION CLARIFICATION  THIS DOCUMENT IS NOT A PERMANENT PART OF THE MEDICAL RECORD  TO RESPOND TO THE THIS QUERY, FOLLOW THE INSTRUCTIONS BELOW:  1. If needed, update documentation for the patient's encounter via the notes activity.  2. Access this query again and click edit on the In [employer].  3. After updating, or not, click F2 to complete all highlighted (required) fields concerning your review. Select "additional documentation in the medical record" OR "no additional documentation provided".  4. Click Sign note button.  5. The deficiency will fall out of your In Basket *Please let us know if you are not able to complete this workflow by phone or e-mail (listed below).  Please update your documentation within the medical record to reflect your response to this query.                                                                                        08/15/11   Dear Dr. Darnelle Catalan, C / Associates,  In a better effort to capture your patient's severity of illness, reflect appropriate length of stay and utilization of resources, a review of the patient medical record has revealed the following indicators.    Based on your clinical judgment, please clarify and document in a progress note and/or discharge summary the clinical condition associated with the following supporting information:  In responding to this query please exercise your independent judgment.  The fact that a query is asked, does not imply that any particular answer is desired or expected.  Pt admitted with DVT.  According to HP pt's appetite is diminished.  Pt's BMI=  17.81  In setting of dementia and diminished appetite necessitating feeding supplement of Resource.                      Please clarify whether or not BMI can be linked to one of he diagnoses listed below and document in pn  and d/c. Thank You!  [facility] PRACT[facility]: When linking BMI to a diagnosis please document both BMI and diagnosis  together in pn for accuracy of SOI and ROM.    Possible Clinical Conditions?   _______Moderate Malnutrition _______Severe Malnutrition   _______Protein Calorie Malnutrition _______Severe Protein Calorie Malnutrition  _______Other Condition________________ _______Cannot clinically determine     Supporting Information: Risk Factors: DVT, diminished appetite, dementia,   Signs & Symptoms: Ht 5'5"  Wt 107 lbs  BMI:  17.81   Diagnostics:  Component Albumin  Latest Ref Rng 3.5 - 5.2 g/dL  02/12/1094 2.2 (L)      Treatment  feeding supplement (RESOURCE BREEZE) liquid 1 Container  Intake and Output Regular diet  You may use possible, probable, or suspect with inpatient documentation. possible, probable, suspected diagnoses MUST be documented at the time of discharge  Reviewed: additional documentation in the medical record  Thank You,  Enis Slipper  RN, BSN, CCDS Clinical Documentation Specialist [facility] Dept Pager: [phone] / E-mail: [email or web address]   Health Information Management [place]

## 2011-08-15 NOTE — Evaluation (Signed)
Physical Therapy Evaluation Patient Details Name: [name] MRN: [id number] DOB: 1918-11-17 Today's Date: 08/15/2011 Time: 4098-1191 PT Time Calculation (min): 20 min  PT Assessment / Plan / Recommendation Clinical Impression  pt admitted for DVT RUE. pt near baseline for mobility per daughter. pt does not require PT at this time. pt is total care for all adl's. PT will sign off.    PT Assessment  Patent does not need any further PT services    Follow Up Recommendations       Barriers to Discharge        Equipment Recommendations  Daughter is interested in a  Hospital bed and air mattress if  cost effective .   Recommendations for Other Services    Frequency      Precautions / Restrictions Precautions Precautions: Fall   Pertinent Vitals/Pain No C/o      Mobility  Bed Mobility Bed Mobility: Supine to Sit Supine to Sit: 2: Max assist Transfers Transfers: Sit to Stand;Stand to Sit;Stand Pivot Transfers Sit to Stand: 1: +2 Total assist;From bed Sit to Stand: Patient Percentage: 20% Stand to Sit: 1: +2 Total assist;To bed;To chair/3-in-1 Stand to Sit: Patient Percentage: 20% Stand Pivot Transfers: 1: +2 Total assist Stand Pivot Transfers: Patient Percentage: 20% Details for Transfer Assistance: pt's legs slide forward during standing attempts.    Exercises     PT Diagnosis:    PT Problem List:   PT Treatment Interventions:     PT Goals    Visit Information  Last PT Received On: 08/15/11 Assistance Needed: +2 PT/OT Co-Evaluation/Treatment: Yes    Subjective Data  Subjective: Pull my socks up Patient Stated Goal: daughter states to take her home.   Prior Functioning  Home Living Lives With: Daughter Available Help at Discharge: Family;Available 24 hours/day Type of Home: Apartment Home Access: Level entry Home Layout: One level Bathroom Shower/Tub:  (bed baths) Bathroom Toilet:  (pt wears briefs.) Home Adaptive Equipment: Wheelchair -  manual;Bedside commode/3-in-1 Prior Function Level of Independence: Needs assistance Needs Assistance: Dressing;Feeding;Grooming;Toileting;Meal Prep;Light Housekeeping;Gait;Transfers Dressing: Total Feeding: Total Grooming: Total Toileting: Total Meal Prep: Total Light Housekeeping: Total Gait Assistance: Pt non-ambulatory for ~1 yr. Transfer Assistance: Pt requires total A +2 to get in and out of bed. Able to Take Stairs?: No Driving: No Vocation: Retired Musician: No difficulties    Cognition  Overall Cognitive Status: History of cognitive impairments - at baseline Arousal/Alertness: Awake/alert Orientation Level: Place;Time Behavior During Session: [facility] for tasks performed    Extremity/Trunk Assessment Right Lower Extremity Assessment RLE ROM/Strength/Tone: Deficits RLE ROM/Strength/Tone Deficits: decreased dorsiflexion. lifts leg from bed. bears weight Left Lower Extremity Assessment LLE ROM/Strength/Tone: Deficits LLE ROM/Strength/Tone Deficits: decreased  dorsiflexion, lifts legs from bed   Balance Balance Balance Assessed: Yes Static Sitting Balance Static Sitting - Balance Support: Bilateral upper extremity supported;Feet unsupported Static Sitting - Level of Assistance: 3: Mod assist  End of Session PT - End of Session Activity Tolerance: Patient tolerated treatment well Patient left: in chair;with call bell/phone within reach;with family/visitor present Nurse Communication: Mobility status  GP     Rada Hay 08/15/2011, 12:04 PM  [phone]

## 2011-08-15 NOTE — Progress Notes (Addendum)
ANTICOAGULATION CONSULT NOTE - Follow Up  Pharmacy Consult for Lovenox & Coumadin Indication: DVT  No Known Allergies  Patient Measurements: Height: 5\' 5"  (165.1 cm) Weight: 107 lb (48.535 kg) IBW/kg (Calculated) : 57   Vital Signs: Temp: 98.6 F (37 C) (07/08 0526) Temp src: Oral (07/08 0526) BP: 110/66 mmHg (07/08 0526) Pulse Rate: 70  (07/08 0526)  Labs:  Basename 08/15/11 0427 08/14/11 0435 08/12/11 1615 08/12/11 1614  HGB 10.8* 10.7* -- --  HCT 32.4* 32.8* 33.2* --  PLT 247 227 232 --  APTT -- -- -- 34  LABPROT 19.3* 16.4* -- 15.9*  INR 1.60* 1.30 -- 1.24  HEPARINUNFRC -- -- -- --  CREATININE -- -- -- 0.63  CKTOTAL -- -- -- --  CKMB -- -- -- --  TROPONINI -- -- -- --    Estimated Creatinine Clearance: 35.8 ml/min (by C-G formula based on Cr of 0.63).  Medications:  Scheduled:     . enoxaparin (LOVENOX) injection  50 mg Subcutaneous Q12H  . feeding supplement  1 Container Oral BID BM  . hydrochlorothiazide  12.5 mg Oral Daily  . sodium chloride  3 mL Intravenous Q12H  . warfarin  3 mg Oral ONCE-1800  . Warfarin - Pharmacist Dosing Inpatient   Does not apply q1800   Assessment:  78yo F admitted with right hand and forearm pain/swelling after recent hospitalization ending 08/05/11. Dopplers confirmed RUE DVT.  Day #4 Lovenox. Day#3 of minium um 5 day Lovenox/Coumadin overlap.  Likely to be sensitive to Coumadin d/t advanced age and low weight. Also, baseline INR was slightly elevated.  INR rising after 3mg  x 2 doses 7/6-7/7  No bleeding reported; CBC ok.  Scr wnl and stable, CrCl 36 ml/min  Goal of Therapy:  INR 2-3 Monitor platelets by anticoagulation protocol: Yes   Plan:   Cont Lovenox 1mg /kg SQ q12h.  Repeat Coumadin 3mg  today.  F/u daily INR and CBC.  Coumadin education completed w/ pt dtr 7/7  Gwen Her PharmD  [phone] 08/15/2011 9:25 AM

## 2011-08-16 DIAGNOSIS — R636 Underweight: Secondary | ICD-10-CM | POA: Diagnosis present

## 2011-08-16 DIAGNOSIS — E440 Moderate protein-calorie malnutrition: Secondary | ICD-10-CM | POA: Diagnosis present

## 2011-08-16 LAB — CBC
HCT: 34.1 % — ABNORMAL LOW (ref 36.0–46.0)
Hemoglobin: 11 g/dL — ABNORMAL LOW (ref 12.0–15.0)
MCHC: 32.3 g/dL (ref 30.0–36.0)
RBC: 4.15 MIL/uL (ref 3.87–5.11)

## 2011-08-16 LAB — PROTIME-INR: INR: 2.52 — ABNORMAL HIGH (ref 0.00–1.49)

## 2011-08-16 MED ORDER — WARFARIN SODIUM 3 MG PO TABS
3.0000 mg | ORAL_TABLET | Freq: Once | ORAL | Status: AC
  Administered 2011-08-16: 3 mg via ORAL
  Filled 2011-08-16: qty 1

## 2011-08-16 NOTE — Progress Notes (Signed)
Patient and Daughter watched video on Lovenox Education.  Daughter was able to explain procedure after watching video.  Daughter planned to watch video again.

## 2011-08-16 NOTE — Progress Notes (Signed)
ANTICOAGULATION CONSULT NOTE - Follow Up  Pharmacy Consult for Lovenox & Coumadin Indication: DVT  No Known Allergies  Patient Measurements: Height: 5\' 5"  (165.1 cm) Weight: 107 lb (48.535 kg) IBW/kg (Calculated) : 57   Vital Signs: Temp: 98 F (36.7 C) (07/09 0800) Temp src: Oral (07/09 0800) BP: 118/67 mmHg (07/09 0800) Pulse Rate: 69  (07/09 0800)  Labs:  Basename 08/16/11 0340 08/15/11 0427 08/14/11 0435  HGB 11.0* 10.8* --  HCT 34.1* 32.4* 32.8*  PLT 294 247 227  APTT -- -- --  LABPROT 27.6* 19.3* 16.4*  INR 2.52* 1.60* 1.30  HEPARINUNFRC -- -- --  CREATININE -- -- --  CKTOTAL -- -- --  CKMB -- -- --  TROPONINI -- -- --    Estimated Creatinine Clearance: 35.8 ml/min (by C-G formula based on Cr of 0.63).  Medications:  Scheduled:     . enoxaparin (LOVENOX) injection  50 mg Subcutaneous Q12H  . feeding supplement  1 Container Oral BID BM  . hydrochlorothiazide  12.5 mg Oral Daily  . sodium chloride  3 mL Intravenous Q12H  . warfarin  3 mg Oral ONCE-1800  . Warfarin - Pharmacist Dosing Inpatient   Does not apply q1800  . DISCONTD: feeding supplement  1 Container Oral BID BM   Assessment:  78yo F admitted with right hand and forearm pain/swelling after recent hospitalization ending 08/05/11. Dopplers confirmed RUE DVT.  Day #4 Lovenox. Day #4 of minium um 5 day Lovenox/Coumadin overlap.  Likely to be sensitive to Coumadin d/t advanced age and low weight. Also, baseline INR was slightly elevated.  INR rising after 3mg  x 3 doses 7/6-7/8  INR therapeutic today. Should begin to stabilize.  No bleeding reported; CBC ok.  Scr wnl and stable, CrCl 36 ml/min  Goal of Therapy:  INR 2-3 Monitor platelets by anticoagulation protocol: Yes   Plan:   Cont Lovenox 1mg /kg SQ q12h. Can d/c tomorrow if INR remains therapeutic.  Repeat Coumadin 3mg  today.  F/u daily INR and CBC.  Coumadin education completed w/ pt dtr 7/7  Tammy Sours PharmD    [phone] 10:35 AM 08/16/2011 10:34 AM

## 2011-08-16 NOTE — Progress Notes (Addendum)
PROGRESS NOTE  [name]:[id number] DOB: 1918/06/23 DOA: 08/12/2011 PCP: Rogelia Boga, MD  Brief narrative: [name] is an 78 y.o. female with a PMH of dementia, prior wrist fractures, recent hospitalization from 08/03/11-08/05/11 for treatment of mental status changes thought to be secondary to a E. Coli UTI (completed a course of Cephalexin) and electrolyte abnormalities who was re-admitted 08/12/11 with suspected RUE DVT. INR now therapeutic, daughter prefers to keep in hospital one extra day until Lovenox can be discontinued, since she does not yet feel entirely comfortable administering injections on her own.  Interim History: Stable overnight.    Assessment/Plan: Principal Problem:  *Probable DVT (deep venous thrombosis) of the RUE   RUE Dopplers done 08/13/11, confirmed DVT.    Continue Lovenox and coumadin.  Lovenox can likely be discontinued 08/17/11, and the patient can be d/c'd on coumadin alone (if INR remains therapeutic).  Will need HHRN to come to draw daily PT/INR and fax to Dr. Lesia Hausen for outpatient coumadin management.  Active Problems:  Moderate malnutrition/underweight (BMI 17.8)  Dietician evaluation performed 08/15/11.  Resource Breeze BID ordered. Decreased mobility  High risk for skin breakdown.  Air mattress overlay.  Heel protectors.  Ordered home hospital bed, air mattress. HYPERTENSION   Continue HCTZ. Dementia   Stable. Wheelchair bound at baseline.  Normocytic anemia  Likely AOCD in this 78 year old female.  Mild.  H&H stable.  Code Status: DNR Family Communication: Daughter updated at bedside. Disposition Plan: Home with HHRN, DME 08/17/11.  Medical Consultants:  None  Other consultants:  Pharmacy: Anticoagulation management.  Physical therapy: No home PT needed (wheelchair bound)  Occupational therapy: DME: Hospital bed/air mattress overlay (ordered)  Dietician: Resource Breeze  BID.  Antibiotics:  None   Subjective  [name] continues to feel well.  Swelling has improved.  Appetite good.  No pain.  No nausea or vomiting.   Objective   Objective: Filed Vitals:   08/15/11 1415 08/15/11 2148 08/16/11 0618 08/16/11 0800  BP: 129/72 130/74 149/77 118/67  Pulse: 68 73 69 69  Temp: 97.8 F (36.6 C) 97.9 F (36.6 C) 98.1 F (36.7 C) 98 F (36.7 C)  TempSrc: Oral Oral Oral Oral  Resp: 18 16 16 18   Height:      Weight:      SpO2: 98% 98% 98% 99%    Intake/Output Summary (Last 24 hours) at 08/16/11 1022 Last data filed at 08/16/11 0900  Gross per 24 hour  Intake    483 ml  Output      0 ml  Net    483 ml    Exam: Gen:  NAD Cardiovascular:  RRR, II/VI SEM Respiratory: Lungs CTAB Gastrointestinal: Abdomen soft, NT/ND with normal active bowel sounds. Extremities: RUE 1+ edema (much improved since admission)    Data Reviewed: Basic Metabolic Panel:  Lab 08/12/11 8119  NA 142  K 3.7  CL 108  CO2 25  GLUCOSE 96  BUN 14  CREATININE 0.63  CALCIUM 9.0  MG --  PHOS --   GFR Estimated Creatinine Clearance: 35.8 ml/min (by C-G formula based on Cr of 0.63). Liver Function Tests:  Lab 08/12/11 1614  AST 13  ALT 6  ALKPHOS 54  BILITOT 0.4  PROT 6.2  ALBUMIN 2.2*   Coagulation profile  Lab 08/16/11 0340 08/15/11 0427 08/14/11 0435 08/12/11 1614  INR 2.52* 1.60* 1.30 1.24  PROTIME -- -- -- --    CBC:  Lab 08/16/11 0340 08/15/11 0427 08/14/11 0435 08/12/11  1615  WBC 8.4 10.0 11.4* 14.2*  NEUTROABS -- -- -- 12.1*  HGB 11.0* 10.8* 10.7* 10.9*  HCT 34.1* 32.4* 32.8* 33.2*  MCV 82.2 82.0 82.0 82.4  PLT 294 247 227 232   Microbiology No results found for this or any previous visit (from the past 240 hour(s)).  Procedures and Diagnostic Studies:  Doppler studies 08/13/11: Preliminary report: There is acute, occlusive DVT noted in the right brachial, axillary and subclavian veins of the right upper extremity.   Scheduled  Meds:    . enoxaparin (LOVENOX) injection  50 mg Subcutaneous Q12H  . feeding supplement  1 Container Oral BID BM  . hydrochlorothiazide  12.5 mg Oral Daily  . sodium chloride  3 mL Intravenous Q12H  . warfarin  3 mg Oral ONCE-1800  . Warfarin - Pharmacist Dosing Inpatient   Does not apply q1800  . DISCONTD: feeding supplement  1 Container Oral BID BM   Continuous Infusions:     LOS: 4 days   Hillery Aldo, MD Pager [phone]  08/16/2011, 10:22 AM

## 2011-08-17 DIAGNOSIS — R269 Unspecified abnormalities of gait and mobility: Secondary | ICD-10-CM

## 2011-08-17 DIAGNOSIS — D649 Anemia, unspecified: Secondary | ICD-10-CM

## 2011-08-17 DIAGNOSIS — K2901 Acute gastritis with bleeding: Secondary | ICD-10-CM

## 2011-08-17 LAB — CBC
HCT: 35.3 % — ABNORMAL LOW (ref 36.0–46.0)
Hemoglobin: 11.5 g/dL — ABNORMAL LOW (ref 12.0–15.0)
MCHC: 32.6 g/dL (ref 30.0–36.0)
RBC: 4.25 MIL/uL (ref 3.87–5.11)
WBC: 8.6 10*3/uL (ref 4.0–10.5)

## 2011-08-17 LAB — PROTIME-INR: INR: 3.38 — ABNORMAL HIGH (ref 0.00–1.49)

## 2011-08-17 NOTE — Progress Notes (Signed)
ANTICOAGULATION CONSULT NOTE - Follow Up  Pharmacy Consult for Lovenox & Coumadin Indication: DVT  No Known Allergies  Patient Measurements: Height: 5\' 5"  (165.1 cm) Weight: 107 lb (48.535 kg) IBW/kg (Calculated) : 57   Vital Signs: Temp: 97.6 F (36.4 C) (07/10 1359) Temp src: Oral (07/10 1359) BP: 128/67 mmHg (07/10 1359) Pulse Rate: 66  (07/10 1359)  Labs:  Basename 08/17/11 0414 08/16/11 0340 08/15/11 0427  HGB 11.5* 11.0* --  HCT 35.3* 34.1* 32.4*  PLT 318 294 247  APTT -- -- --  LABPROT 34.7* 27.6* 19.3*  INR 3.38* 2.52* 1.60*  HEPARINUNFRC -- -- --  CREATININE -- -- --  CKTOTAL -- -- --  CKMB -- -- --  TROPONINI -- -- --    Estimated Creatinine Clearance: 35.8 ml/min (by C-G formula based on Cr of 0.63).  Medications:  Scheduled:     . enoxaparin (LOVENOX) injection  50 mg Subcutaneous Q12H  . feeding supplement  1 Container Oral BID BM  . hydrochlorothiazide  12.5 mg Oral Daily  . sodium chloride  3 mL Intravenous Q12H  . warfarin  3 mg Oral ONCE-1800  . Warfarin - Pharmacist Dosing Inpatient   Does not apply q1800   Assessment:  78yo F admitted with right hand and forearm pain/swelling after recent hospitalization ending 08/05/11. Dopplers confirmed RUE DVT.  Day #5 Lovenox. Day #5 of minium um 5 day Lovenox/Coumadin overlap.  Likely to be sensitive to Coumadin d/t advanced age and low weight. Also, baseline INR was slightly elevated.  INR rising after 3mg  x 4 doses 7/6-7/9  INR supratherapeutic today.  No bleeding reported; CBC ok.  Scr wnl and stable, CrCl 36 ml/min  Goal of Therapy:  INR 2-3 Monitor platelets by anticoagulation protocol: Yes   Plan:   Hold Coumadin today.   D/C lovenox today.   F/u daily INR and CBC.  Coumadin education completed w/ pt dtr 7/7  Tammy Sours PharmD  [phone] 3:15 PM 08/17/2011 3:15 PM

## 2011-08-17 NOTE — Progress Notes (Signed)
[facility] PROGRESS NOTE  [place] WJX:[id number] DOB: [birth year] DOA: 08/12/2011 PCP: Rogelia Boga, MD  Brief narrative:  78 y.o. female with a PMH of dementia, prior wrist fractures, recent hospitalization from 08/03/11-08/05/11 for treatment of mental status changes thought to be secondary to a E. Coli UTI (completed a course of Cephalexin) and electrolyte abnormalities who was re-admitted 08/12/11 with suspected RUE DVT. Now on coumadin, supra therapeutic INR today  Assessment/Plan:   Principal Problem:  *Probable DVT (deep venous thrombosis) of the RUE  RUE Dopplers done 08/13/11, confirmed DVT.  Continue Lovenox and coumadin.  Lovenox can be discontinued and INR is slightly supra therapeutic today Follow up INR in am and likely d/c home if INR  2-3 range  Active Problems:  Moderate malnutrition/underweight (BMI 17.8)  Dietician evaluation performed 08/15/11.  Resource Breeze BID ordered. Decreased mobility  High risk for skin breakdown.  Air mattress overlay.  Heel protectors.  Ordered home hospital bed, air mattress. HYPERTENSION  Continue HCTZ. Dementia  Stable. Wheelchair bound at baseline. Normocytic anemia  Likely AOCD in this 78 year old female. Mild.  H&H stable.  Code Status: DNR  Family Communication: Daughter updated at bedside.  Disposition Plan: Home with HHRN, DME 08/17/11.   Medical Consultants:  None Other consultants:  Pharmacy: Anticoagulation management.  Physical therapy: No home PT needed (wheelchair bound)  Occupational therapy: DME: Hospital bed/air mattress overlay (ordered)  Dietician: Resource Breeze BID. Antibiotics:  None  Manson Passey, MD  [facility] Pager [phone]  If 7PM-7AM, please contact night-coverage [email or web address] Password [facility] 08/17/2011, 5:33 PM   LOS: 5 days   HPI/Subjective: No acute events overnight.  Objective: Filed Vitals:   08/16/11 1341 08/16/11 2157 08/17/11 0609 08/17/11  1359  BP: 94/59 129/72 119/68 128/67  Pulse: 69 70 65 66  Temp: 97.8 F (36.6 C) 98 F (36.7 C) 97.8 F (36.6 C) 97.6 F (36.4 C)  TempSrc: Oral Oral Oral Oral  Resp: 16 16 16 16   Height:      Weight:      SpO2: 97% 100% 98% 100%    Intake/Output Summary (Last 24 hours) at 08/17/11 1733 Last data filed at 08/17/11 1300  Gross per 24 hour  Intake    900 ml  Output      0 ml  Net    900 ml    Exam:   General:  Pt is alert,not in acute distress  Cardiovascular: Regular rate and rhythm, S1/S2, no murmurs, no rubs, no gallops  Respiratory: Clear to auscultation bilaterally, no wheezing, no crackles, no rhonchi  Abdomen: Soft, non tender, non distended, bowel sounds present, no guarding  Extremities: No edema, pulses DP and PT palpable bilaterally  Neuro: Grossly nonfocal  Data Reviewed: Basic Metabolic Panel:  Lab 08/12/11 7846  NA 142  K 3.7  CL 108  CO2 25  GLUCOSE 96  BUN 14  CREATININE 0.63  CALCIUM 9.0   Liver Function Tests:  Lab 08/12/11 1614  AST 13  ALT 6  ALKPHOS 54  BILITOT 0.4  PROT 6.2  ALBUMIN 2.2*   CBC:  Lab 08/17/11 0414 08/16/11 0340 08/15/11 0427 08/14/11 0435 08/12/11 1615  WBC 8.6 8.4 10.0 11.4* 14.2*  HGB 11.5* 11.0* 10.8* 10.7* 10.9*  HCT 35.3* 34.1* 32.4* 32.8* 33.2*  MCV 83.1 82.2 82.0 82.0 82.4  PLT 318 294 247 227 232    Studies: No results found.  Scheduled Meds:   . enoxaparin injection  50 mg Subcutaneous Q12H  .  feeding supplement  1 Container Oral BID BM  . hydrochlorothiazide  12.5 mg Oral Daily  . warfarin  3 mg Oral ONCE-1800   Continuous Infusions:

## 2011-08-18 DIAGNOSIS — R638 Other symptoms and signs concerning food and fluid intake: Secondary | ICD-10-CM

## 2011-08-18 LAB — CBC
HCT: 35.4 % — ABNORMAL LOW (ref 36.0–46.0)
Hemoglobin: 11.4 g/dL — ABNORMAL LOW (ref 12.0–15.0)
RBC: 4.26 MIL/uL (ref 3.87–5.11)
WBC: 8.3 10*3/uL (ref 4.0–10.5)

## 2011-08-18 LAB — PROTIME-INR
INR: 3.76 — ABNORMAL HIGH (ref 0.00–1.49)
Prothrombin Time: 37.7 seconds — ABNORMAL HIGH (ref 11.6–15.2)

## 2011-08-18 NOTE — Progress Notes (Signed)
ANTICOAGULATION CONSULT NOTE - Follow Up  Pharmacy Consult for Lovenox & Coumadin Indication: DVT  No Known Allergies  Patient Measurements: Height: 5\' 5"  (165.1 cm) Weight: 107 lb (48.535 kg) IBW/kg (Calculated) : 57   Vital Signs: Temp: 97.3 F (36.3 C) (07/11 0625) Temp src: Oral (07/11 0625) BP: 122/67 mmHg (07/11 0625) Pulse Rate: 71  (07/11 0625)  Labs:  Basename 08/18/11 0341 08/17/11 0414 08/16/11 0340  HGB 11.4* 11.5* --  HCT 35.4* 35.3* 34.1*  PLT 334 318 294  APTT -- -- --  LABPROT 37.7* 34.7* 27.6*  INR 3.76* 3.38* 2.52*  HEPARINUNFRC -- -- --  CREATININE -- -- --  CKTOTAL -- -- --  CKMB -- -- --  TROPONINI -- -- --    Estimated Creatinine Clearance: 35.8 ml/min (by C-G formula based on Cr of 0.63).  Medications:  Scheduled:     . enoxaparin (LOVENOX) injection  50 mg Subcutaneous Q12H  . feeding supplement  1 Container Oral BID BM  . hydrochlorothiazide  12.5 mg Oral Daily  . sodium chloride  3 mL Intravenous Q12H  . Warfarin - Pharmacist Dosing Inpatient   Does not apply q1800   Assessment:  78yo F admitted with right hand and forearm pain/swelling after recent hospitalization ending 08/05/11. Dopplers confirmed RUE DVT.  Lovenox overlap completed and d/ced 7/10.   Likely to be sensitive to Coumadin d/t advanced age and low weight. Also, baseline INR was slightly elevated.  INR rising after 3mg  x 4 doses 7/6-7/9  Coumadin held on 7/10 d/t supratherapeutic INR 3.38  INR rising still and supratherapeutic today at 3.76.  No bleeding reported; CBC ok.  Scr wnl and stable, CrCl 36 ml/min  Goal of Therapy:  INR 2-3 Monitor platelets by anticoagulation protocol: Yes   Plan:   Hold Coumadin today. When INR therapeutic will start coumadin at much lower dose.   F/u daily INR and CBC.  Coumadin education completed w/ pt dtr 7/7  Tammy Sours PharmD  [phone] 8:28 AM 08/18/2011 8:28 AM

## 2011-08-18 NOTE — Progress Notes (Signed)
Patient ID: [name], female   DOB: 27-Aug-1918, 78 y.o.   MRN: [id number] [facility] PROGRESS NOTE  [facility] WUJ:[id number] DOB: 12-Jan-1919 DOA: 08/12/2011 PCP: Rogelia Boga, MD  Brief narrative:  78 y.o. female with a PMH of dementia, prior wrist fractures, recent hospitalization from 08/03/11-08/05/11 for treatment of mental status changes thought to be secondary to a E. Coli UTI (completed a course of Cephalexin) and electrolyte abnormalities who was re-admitted 08/12/11 with suspected RUE DVT. Now on coumadin, supra therapeutic INR today   Assessment/Plan:   Principal Problem:  *Probable DVT (deep venous thrombosis) of the RUE  RUE Dopplers done 08/13/11, confirmed DVT.  Hold coumadin tonight  Lovenox discontinued and INR is slightly supra therapeutic today  Follow up INR in am   Active Problems:  Moderate malnutrition/underweight (BMI 17.8)  Dietician evaluation performed 08/15/11.  Resource Breeze BID ordered. Decreased mobility  High risk for skin breakdown.  Air mattress overlay.  Heel protectors.  Ordered home hospital bed, air mattress. HYPERTENSION  Continue HCTZ. Dementia, functional quadriplegia Stable. Wheelchair bound at baseline. Normocytic anemia  Likely AOCD in this 78 year old female. Mild.  H&H stable.  Code Status: DNR  Family Communication: Daughter updated at bedside.  Disposition Plan: Home with HHRN, DME 08/17/11.   Medical Consultants:  None Other consultants:  Pharmacy: Anticoagulation management.  Physical therapy: No home PT needed (wheelchair bound)  Occupational therapy: DME: Hospital bed/air mattress overlay (ordered)  Dietician: Resource Breeze BID. Antibiotics:  None  Manson Passey, MD  [facility] Pager [phone]  If 7PM-7AM, please contact night-coverage [email or web address] Password [facility] 08/18/2011, 1:34 PM   LOS: 6 days   HPI/Subjective: No acute events overnight.  Objective: Filed Vitals:    08/17/11 0609 08/17/11 1359 08/17/11 2123 08/18/11 0625  BP: 119/68 128/67 116/73 122/67  Pulse: 65 66 69 71  Temp: 97.8 F (36.6 C) 97.6 F (36.4 C) 97.5 F (36.4 C) 97.3 F (36.3 C)  TempSrc: Oral Oral Oral Oral  Resp: 16 16 16 16   Height:      Weight:      SpO2: 98% 100% 100% 100%    Intake/Output Summary (Last 24 hours) at 08/18/11 1334 Last data filed at 08/18/11 1013  Gross per 24 hour  Intake    840 ml  Output      0 ml  Net    840 ml    Exam:   General:  Pt is alert, follows commands appropriately, not in acute distress  Cardiovascular: Regular rate and rhythm, S1/S2, no murmurs, no rubs, no gallops  Respiratory: Clear to auscultation bilaterally, no wheezing, no crackles, no rhonchi  Abdomen: Soft, non tender, non distended, bowel sounds present, no guarding  Extremities: No edema, pulses DP and PT palpable bilaterally  Neuro: Grossly nonfocal  Data Reviewed: Basic Metabolic Panel:  Lab 08/12/11 8657  NA 142  K 3.7  CL 108  CO2 25  GLUCOSE 96  BUN 14  CREATININE 0.63  CALCIUM 9.0  MG --  PHOS --   Liver Function Tests:  Lab 08/12/11 1614  AST 13  ALT 6  ALKPHOS 54  BILITOT 0.4  PROT 6.2  ALBUMIN 2.2*   CBC:  Lab 08/18/11 0341 08/17/11 0414 08/16/11 0340 08/15/11 0427 08/14/11 0435  WBC 8.3 8.6 8.4 10.0 11.4*  HGB 11.4* 11.5* 11.0* 10.8* 10.7*  HCT 35.4* 35.3* 34.1* 32.4* 32.8*  MCV 83.1 83.1 82.2 82.0 82.0  PLT 334 318 294 247 227   Studies: No  results found.  Scheduled Meds:   . enoxaparin (LOVENOX) injection  50 mg Subcutaneous Q12H  . feeding supplement  1 Container Oral BID BM  . hydrochlorothiazide  12.5 mg Oral Daily  . sodium chloride  3 mL Intravenous Q12H  . Warfarin - Pharmacist Dosing Inpatient   Does not apply q1800   Continuous Infusions:

## 2011-08-19 LAB — CBC
HCT: 33.4 % — ABNORMAL LOW (ref 36.0–46.0)
Hemoglobin: 10.9 g/dL — ABNORMAL LOW (ref 12.0–15.0)
RBC: 4.05 MIL/uL (ref 3.87–5.11)
WBC: 7.4 10*3/uL (ref 4.0–10.5)

## 2011-08-19 LAB — PROTIME-INR
INR: 3.18 — ABNORMAL HIGH (ref 0.00–1.49)
Prothrombin Time: 33.1 seconds — ABNORMAL HIGH (ref 11.6–15.2)

## 2011-08-19 MED ORDER — HYDROCHLOROTHIAZIDE 12.5 MG PO CAPS: 12.5000 mg | ORAL_CAPSULE | Freq: Every day | ORAL | Status: DC

## 2011-08-19 MED ORDER — POLYETHYLENE GLYCOL 3350 17 G PO PACK: 17.0000 g | PACK | Freq: Every day | ORAL | Status: AC | PRN

## 2011-08-19 MED ORDER — ENSURE PUDDING PO PUDG: 1.0000 | Freq: Two times a day (BID) | ORAL | Status: DC

## 2011-08-19 MED ORDER — WARFARIN SODIUM 1 MG PO TABS: 1.0000 mg | ORAL_TABLET | ORAL | Status: DC

## 2011-08-19 MED ORDER — ALUM & MAG HYDROXIDE-SIMETH 200-200-20 MG/5ML PO SUSP: 30.0000 mL | Freq: Four times a day (QID) | ORAL | Status: AC | PRN

## 2011-08-19 NOTE — Discharge Summary (Signed)
Physician Discharge Summary  [facility] ZOX:[id number] DOB: October 17, 1918 DOA: 08/12/2011  PCP: Rogelia Boga, MD  Admit date: 08/12/2011 Discharge date: 08/19/2011  Recommendations for Outpatient Follow-up:  INR check Monday 08/22/2011 with PCP at 2 pm; appointment scheduled by me already  Discharge Condition: medically stable for discharge home today   Diet recommendation: regular diet  History of present illness:  Brief narrative:  78 y.o. female with a PMH of dementia, prior wrist fractures, recent hospitalization from 08/03/11-08/05/11 for treatment of mental status changes thought to be secondary to a E. Coli UTI (completed a course of Cephalexin) and electrolyte abnormalities who was re-admitted 08/12/11 with suspected RUE DVT.   Assessment/Plan:   Principal Problem:  * DVT (deep venous thrombosis) of the RUE  RUE Dopplers done 08/13/11, confirmed DVT.  Continue coumadin from 08/20/2011 1 mg at bedtime and INR check Monday 08/22/2011 to adjust the dose accordingly  INR today 3.18  Active Problems:  Moderate malnutrition/underweight (BMI 17.8)  Dietician evaluation performed 08/15/11.  Resource Breeze BID ordered. Decreased mobility, functional quadriplegia High risk for skin breakdown.  Air mattress overlay.  Heel protectors.  Ordered home hospital bed, air mattress. HYPERTENSION  Continue HCTZ. Dementia  Stable. Wheelchair bound at baseline. Normocytic anemia  Likely AOCD in this 78 year old female. Mild.  H&H stable.  Code Status: DNR  Family Communication: Daughter updated at bedside.  Disposition Plan: Home with [facility], DME 08/17/11; stable for discharge home today; family is aware of discharge plan; RN is aware of patient being discahrged today  Medical Consultants:  None Other consultants:  Pharmacy: Anticoagulation management.  Physical therapy: No home PT needed (wheelchair bound)  Occupational therapy: DME: Hospital bed/air mattress overlay (ordered)    Dietician: Resource Breeze BID. Antibiotics:  None  Discharge Exam: Filed Vitals:   08/19/11 0601  BP: 128/77  Pulse: 70  Temp: 98.5 F (36.9 C)  Resp: 16   Filed Vitals:   08/18/11 0625 08/18/11 1450 08/18/11 2212 08/19/11 0601  BP: 122/67 107/63 110/68 128/77  Pulse: 71 69 70 70  Temp: 97.3 F (36.3 C) 98.1 F (36.7 C) 98.8 F (37.1 C) 98.5 F (36.9 C)  TempSrc: Oral Oral Axillary Axillary  Resp: 16 23 18 16   Height:      Weight:      SpO2: 100% 98% 97% 100%    General: Pt is alert, follows commands appropriately, not in acute distress Cardiovascular: Regular rate and rhythm, S1/S2 +, no murmurs, no rubs, no gallops Respiratory: Clear to auscultation bilaterally, no wheezing, no crackles, no rhonchi Abdominal: Soft, non tender, non distended, bowel sounds +, no guarding Extremities: no edema, no cyanosis, pulses palpable bilaterally DP and PT Neuro: Grossly nonfocal  Discharge Instructions  Discharge Orders    Future Orders Please Complete By Expires   Diet - low sodium heart healthy      Increase activity slowly      Discharge instructions      Comments:   Please do not take coumadin today 08/19/2011 Take coumadin from 08/20/2011 and then 08/21/2011 1 mg by mouth at bedtime Re check INR in PCP office Monday 08/22/2011 and then readjust coumadin dose from 08/22/2011   Call MD for:  persistant nausea and vomiting      Call MD for:  severe uncontrolled pain      Call MD for:  difficulty breathing, headache or visual disturbances      Call MD for:  extreme fatigue      Call MD  for:  persistant dizziness or light-headedness        Medication List  As of 08/19/2011  9:59 AM   STOP taking these medications         cephALEXin 500 MG capsule         TAKE these medications         alum & mag hydroxide-simeth 200-200-20 MG/5ML suspension   Commonly known as: MAALOX/MYLANTA   Take 30 mLs by mouth every 6 (six) hours as needed (dyspepsia).      feeding supplement  Liqd   Take 1 Container by mouth 2 (two) times daily between meals.      feeding supplement Pudg   Take 1 Container by mouth 2 (two) times daily between meals.      hydrochlorothiazide 12.5 MG capsule   Commonly known as: MICROZIDE   Take 1 capsule (12.5 mg total) by mouth daily.      polyethylene glycol packet   Commonly known as: MIRALAX / GLYCOLAX   Take 17 g by mouth daily as needed.      warfarin 1 MG tablet   Commonly known as: COUMADIN   Take 1 tablet (1 mg total) by mouth as directed.           Follow-up Information    Follow up with Rogelia Boga, MD. Schedule an appointment as soon as possible for a visit on 08/22/2011. (9 am)    Contact information:   [address] [place] [address] [phone]           The results of significant diagnostics from this hospitalization (including imaging, microbiology, ancillary and laboratory) are listed below for reference.    Significant Diagnostic Studies: Dg Chest Port 1 View 08/03/2011  *RADIOLOGY REPORT*  Clinical Data: Fever and weakness.  PORTABLE CHEST - 1 VIEW  Comparison: Chest x-ray 01/21/2005.  Findings: Assessment of the lung apices and upper mediastinum is limited by patient's kyphotic positioning.  Within the well visualized portions of the thorax there is no definite acute consolidative airspace disease, and no definite pleural effusions. Pulmonary vasculature appears normal.  Heart size is mildly enlarged (unchanged).  Atherosclerotic calcifications within the arch of the aorta.  Right-sided pacemaker device in place with the tip projecting over the expected location of the right ventricular apex.  IMPRESSION: 1.  No definite radiographic evidence of acute cardiopulmonary disease. 2.  Mild cardiomegaly. 3.  Atherosclerosis.     Microbiology: No results found for this or any previous visit (from the past 240 hour(s)).   Labs: Basic Metabolic Panel:  Lab 08/12/11 5621  NA 142    K 3.7  CL 108  CO2 25  GLUCOSE 96  BUN 14  CREATININE 0.63  CALCIUM 9.0   Liver Function Tests:  Lab 08/12/11 1614  AST 13  ALT 6  ALKPHOS 54  BILITOT 0.4  PROT 6.2  ALBUMIN 2.2*   CBC:  Lab 08/19/11 0403 08/18/11 0341 08/17/11 0414 08/16/11 0340 08/15/11 0427 08/12/11 1615  WBC 7.4 8.3 8.6 8.4 10.0 --  HGB 10.9* 11.4* 11.5* 11.0* 10.8* --  HCT 33.4* 35.4* 35.3* 34.1* 32.4* --  MCV 82.5 83.1 83.1 82.2 82.0 --  PLT 320 334 318 294 247 --    Time coordinating discharge: Over 30 minutes  Signed:  Manson Passey, MD  [facility] 08/19/2011, 9:59 AM  Pager #: [phone]

## 2011-08-22 ENCOUNTER — Ambulatory Visit (INDEPENDENT_AMBULATORY_CARE_PROVIDER_SITE_OTHER): Payer: Medicare Other | Admitting: Family

## 2011-08-22 ENCOUNTER — Encounter: Payer: Self-pay | Admitting: Family

## 2011-08-22 ENCOUNTER — Telehealth: Payer: Self-pay | Admitting: Family Medicine

## 2011-08-22 DIAGNOSIS — I82409 Acute embolism and thrombosis of unspecified deep veins of unspecified lower extremity: Secondary | ICD-10-CM

## 2011-08-22 DIAGNOSIS — I059 Rheumatic mitral valve disease, unspecified: Secondary | ICD-10-CM | POA: Insufficient documentation

## 2011-08-22 DIAGNOSIS — Z954 Presence of other heart-valve replacement: Secondary | ICD-10-CM

## 2011-08-22 DIAGNOSIS — I4891 Unspecified atrial fibrillation: Secondary | ICD-10-CM | POA: Insufficient documentation

## 2011-08-22 DIAGNOSIS — I824Y9 Acute embolism and thrombosis of unspecified deep veins of unspecified proximal lower extremity: Secondary | ICD-10-CM

## 2011-08-22 DIAGNOSIS — Z950 Presence of cardiac pacemaker: Secondary | ICD-10-CM

## 2011-08-22 DIAGNOSIS — I359 Nonrheumatic aortic valve disorder, unspecified: Secondary | ICD-10-CM | POA: Insufficient documentation

## 2011-08-22 LAB — POCT INR: INR: 3.4

## 2011-08-22 MED ORDER — WARFARIN SODIUM 1 MG PO TABS: 1.0000 mg | ORAL_TABLET | ORAL | Status: DC

## 2011-08-22 NOTE — Telephone Encounter (Signed)
Please verify that patient is taking 1mg  a day. If so, her INR goal based upon her condition is 2.5-3.5. So continue the current dose and recheck in 3 weeks.

## 2011-08-22 NOTE — Telephone Encounter (Signed)
[facility] called with results: INR 3.4 and PT 32.0 pt took 1mg  on Saturday and 1mg  on Sunday. Nothing yet today. Please call Lyla Son with orders Coumadin.

## 2011-08-22 NOTE — Telephone Encounter (Signed)
Pt called back, pt requesting to be contact at [phone]

## 2011-08-22 NOTE — Patient Instructions (Addendum)
Continue current dosage. Recheck in 3 weeks.   Latest dosing instructions   Total Sun Mon Tue Wed Thu Fri Sat   7 1 mg 1 mg 1 mg 1 mg 1 mg 1 mg 1 mg    (1 mg1) (1 mg1) (1 mg1) (1 mg1) (1 mg1) (1 mg1) (1 mg1)

## 2011-08-22 NOTE — Telephone Encounter (Signed)
LMTCB

## 2011-08-22 NOTE — Telephone Encounter (Signed)
Pt's daughter and [facility] nurse, Lyla Son, aware of NP's note.   Per Lyla Son, pt will have to come in the office for next INR check because [facility] will not being seeing her in 3 weeks. Also pt needs Rx for coumadin sent to pharmacy.  Rx sent.  Please call to schedule PT/INR appt for 09/12/2011.  Spoke with daughter, Dondra Spry, to schedule 3 week appt. She states that she has to have her sister that brings pt to doctors appts to call office to schedule appt

## 2011-08-24 ENCOUNTER — Telehealth: Payer: Self-pay | Admitting: Internal Medicine

## 2011-08-24 NOTE — Telephone Encounter (Signed)
Caller: Gloria/Child; Phone Number: [phone]; Message from caller: Daughter , [name] calling.  Can the home health nurse do the lab work instead of pt.  Having an office visit?  States that the MD had told them that she was to fragile to bring out in the car but someone called from the office with an appointment time.

## 2011-08-24 NOTE — Telephone Encounter (Signed)
New msg Pt's daughter called to say she has been bedridden since she came home from hospital. She is concerned about pacemaker check. Please call

## 2011-08-25 NOTE — Telephone Encounter (Signed)
lmom for daughter to return my call. 

## 2011-08-25 NOTE — Telephone Encounter (Signed)
ok 

## 2011-08-25 NOTE — Telephone Encounter (Signed)
pts daughter aware, she will tell home health nurse and have them call for order if needed

## 2011-08-29 ENCOUNTER — Telehealth: Payer: Self-pay | Admitting: Internal Medicine

## 2011-08-29 NOTE — Telephone Encounter (Signed)
Pt daughter rtning your call from last week

## 2011-08-29 NOTE — Telephone Encounter (Signed)
lmom for daughter not patient

## 2011-08-29 NOTE — Telephone Encounter (Signed)
lmom for patient to call me back. 

## 2011-09-05 ENCOUNTER — Encounter: Payer: Self-pay | Admitting: Internal Medicine

## 2011-09-05 NOTE — Telephone Encounter (Signed)
If they are not planning on doing a change out at [facility] then it is ok if we do not continue device interrogation  He daughter says the [facility] has told her about a [employer] service that may provide transportation to the office.  She is going to see about this and call to schedule appointment for mid August if this is possible

## 2011-09-05 NOTE — Telephone Encounter (Signed)
Please return call to Daughter-  Shauna Hugh  [phone]  Patient has home care orders, daughter wants to know what arrangements can be made for pacer ck in home besides remote transmission.

## 2011-09-05 NOTE — Telephone Encounter (Signed)
Spoke with patients daughter.  Let her know if they are not planning on changing out the device once it reaches ERI

## 2011-09-05 NOTE — Telephone Encounter (Signed)
[name] 09/05/2011 2:36 PM Signed  Please return call to Daughter- Shauna Hugh [phone]  Patient has home care orders, daughter wants to know what arrangements can be made for pacer ck in home besides remote transmission.

## 2011-09-05 NOTE — Telephone Encounter (Signed)
This encounter was created in error - please disregard.

## 2011-09-06 ENCOUNTER — Telehealth: Payer: Self-pay | Admitting: Internal Medicine

## 2011-09-06 ENCOUNTER — Encounter: Payer: Self-pay | Admitting: Internal Medicine

## 2011-09-06 NOTE — Telephone Encounter (Signed)
Nurse states when last PT/INR results were called in it was noted that the patient would be discharged them.  The pt's family is under the impression the INR  can continue to be monitored at home.  However Medicare will not cover long term INR monitoring.  Please call to discuss as pt would need to come in to the office for regular monitoring.

## 2011-09-07 ENCOUNTER — Telehealth: Payer: Self-pay | Admitting: Internal Medicine

## 2011-09-07 NOTE — Telephone Encounter (Signed)
Left message on cell # for pt to call back. No voicemail picked up for home #

## 2011-09-07 NOTE — Telephone Encounter (Signed)
Fu call Pt's daughter returning your call 

## 2011-09-07 NOTE — Telephone Encounter (Signed)
Called and spoke with daughter [name]  Not sure who called

## 2011-09-07 NOTE — Telephone Encounter (Signed)
Looks as if it was Dr Vernon Prey office that called.  I have called the daughter back and let her know this

## 2011-09-07 NOTE — Telephone Encounter (Signed)
#   is wrong The info I have before has a different name and #

## 2011-09-09 ENCOUNTER — Telehealth: Payer: Self-pay | Admitting: Internal Medicine

## 2011-09-09 NOTE — Telephone Encounter (Signed)
[facility] called re: pts INR readings. PT is 31.0 and INR is 2.6  Pt is taking 1 mg of coumadin daily.

## 2011-09-09 NOTE — Telephone Encounter (Signed)
See other phone note

## 2011-09-09 NOTE — Telephone Encounter (Signed)
Spoke with Shauna Hugh, pt's daughter. Advised her to have pt continue with 1mg  qd and we will recheck in 4 weeks. Unable to schedule appt because Ms. Toni Arthurs states that she has to speak with her sister 1st due to the fact that the sister is the mode of transportation for pt. I also advised pt's daughter that if I have not heard from anyone by Wednesday, then I will be calling back. Ms. Toni Arthurs verbalized understanding

## 2011-09-09 NOTE — Telephone Encounter (Signed)
Left message for Carrie to call back.

## 2011-09-28 ENCOUNTER — Telehealth: Payer: Self-pay | Admitting: Internal Medicine

## 2011-09-28 NOTE — Telephone Encounter (Signed)
Requesting verbal order to continue Physical Therapy  for another 3-4 weeks

## 2011-09-28 NOTE — Telephone Encounter (Signed)
Called and gave order to continue PT

## 2011-10-07 ENCOUNTER — Telehealth: Payer: Self-pay | Admitting: Internal Medicine

## 2011-10-07 NOTE — Telephone Encounter (Signed)
Pt is home bound and can not come to any other appt's so family is trying to get other options for a device check for her

## 2011-10-10 ENCOUNTER — Inpatient Hospital Stay (HOSPITAL_COMMUNITY)
Admission: EM | Admit: 2011-10-10 | Discharge: 2011-10-13 | DRG: 291 | Disposition: A | Discharge status: Home Health | Payer: Medicare Other | Attending: Family Medicine | Admitting: Family Medicine

## 2011-10-10 ENCOUNTER — Emergency Department (HOSPITAL_COMMUNITY): Payer: Medicare Other

## 2011-10-10 ENCOUNTER — Encounter (HOSPITAL_COMMUNITY): Payer: Self-pay | Admitting: Emergency Medicine

## 2011-10-10 DIAGNOSIS — I82729 Chronic embolism and thrombosis of deep veins of unspecified upper extremity: Secondary | ICD-10-CM | POA: Diagnosis present

## 2011-10-10 DIAGNOSIS — Z7901 Long term (current) use of anticoagulants: Secondary | ICD-10-CM

## 2011-10-10 DIAGNOSIS — F0390 Unspecified dementia without behavioral disturbance: Secondary | ICD-10-CM | POA: Diagnosis present

## 2011-10-10 DIAGNOSIS — K2901 Acute gastritis with bleeding: Secondary | ICD-10-CM

## 2011-10-10 DIAGNOSIS — E876 Hypokalemia: Secondary | ICD-10-CM | POA: Diagnosis present

## 2011-10-10 DIAGNOSIS — E440 Moderate protein-calorie malnutrition: Secondary | ICD-10-CM | POA: Diagnosis present

## 2011-10-10 DIAGNOSIS — R636 Underweight: Secondary | ICD-10-CM | POA: Diagnosis present

## 2011-10-10 DIAGNOSIS — I359 Nonrheumatic aortic valve disorder, unspecified: Secondary | ICD-10-CM

## 2011-10-10 DIAGNOSIS — R2689 Other abnormalities of gait and mobility: Secondary | ICD-10-CM | POA: Diagnosis present

## 2011-10-10 DIAGNOSIS — Z681 Body mass index (BMI) 19 or less, adult: Secondary | ICD-10-CM

## 2011-10-10 DIAGNOSIS — R532 Functional quadriplegia: Secondary | ICD-10-CM | POA: Diagnosis present

## 2011-10-10 DIAGNOSIS — I10 Essential (primary) hypertension: Secondary | ICD-10-CM | POA: Diagnosis present

## 2011-10-10 DIAGNOSIS — R5381 Other malaise: Secondary | ICD-10-CM | POA: Diagnosis present

## 2011-10-10 DIAGNOSIS — Z950 Presence of cardiac pacemaker: Secondary | ICD-10-CM

## 2011-10-10 DIAGNOSIS — Z993 Dependence on wheelchair: Secondary | ICD-10-CM

## 2011-10-10 DIAGNOSIS — D649 Anemia, unspecified: Secondary | ICD-10-CM | POA: Diagnosis present

## 2011-10-10 DIAGNOSIS — I059 Rheumatic mitral valve disease, unspecified: Secondary | ICD-10-CM

## 2011-10-10 DIAGNOSIS — I509 Heart failure, unspecified: Secondary | ICD-10-CM

## 2011-10-10 DIAGNOSIS — R001 Bradycardia, unspecified: Secondary | ICD-10-CM

## 2011-10-10 DIAGNOSIS — I82629 Acute embolism and thrombosis of deep veins of unspecified upper extremity: Secondary | ICD-10-CM

## 2011-10-10 DIAGNOSIS — I5031 Acute diastolic (congestive) heart failure: Principal | ICD-10-CM | POA: Diagnosis present

## 2011-10-10 DIAGNOSIS — I4891 Unspecified atrial fibrillation: Secondary | ICD-10-CM | POA: Diagnosis present

## 2011-10-10 DIAGNOSIS — R0602 Shortness of breath: Secondary | ICD-10-CM | POA: Diagnosis present

## 2011-10-10 DIAGNOSIS — M40209 Unspecified kyphosis, site unspecified: Secondary | ICD-10-CM

## 2011-10-10 HISTORY — DX: Anemia, unspecified: D64.9

## 2011-10-10 HISTORY — DX: Personal history of other venous thrombosis and embolism: Z86.718

## 2011-10-10 HISTORY — DX: Unspecified dementia, unspecified severity, without behavioral disturbance, psychotic disturbance, mood disturbance, and anxiety: F03.90

## 2011-10-10 HISTORY — DX: Unspecified kyphosis, site unspecified: M40.209

## 2011-10-10 HISTORY — DX: Non-pressure chronic ulcer of unspecified part of right lower leg with unspecified severity: L97.919

## 2011-10-10 HISTORY — DX: Unspecified atrial fibrillation: I48.91

## 2011-10-10 HISTORY — DX: Hyperlipidemia, unspecified: E78.5

## 2011-10-10 HISTORY — DX: Other malaise: R53.81

## 2011-10-10 HISTORY — DX: Bradycardia, unspecified: R00.1

## 2011-10-10 LAB — BASIC METABOLIC PANEL
BUN: 19 mg/dL (ref 6–23)
Calcium: 9.6 mg/dL (ref 8.4–10.5)
Chloride: 107 mEq/L (ref 96–112)
Creatinine, Ser: 0.64 mg/dL (ref 0.50–1.10)
GFR calc Af Amer: 88 mL/min — ABNORMAL LOW (ref >90)
GFR calc non Af Amer: 76 mL/min — ABNORMAL LOW (ref >90)

## 2011-10-10 LAB — PRO B NATRIURETIC PEPTIDE: Pro B Natriuretic peptide (BNP): 9771 pg/mL — ABNORMAL HIGH (ref 0–450)

## 2011-10-10 LAB — CBC
HCT: 36.5 % (ref 36.0–46.0)
MCHC: 32.6 g/dL (ref 30.0–36.0)
Platelets: 244 10*3/uL (ref 150–400)
RDW: 17.3 % — ABNORMAL HIGH (ref 11.5–15.5)
WBC: 7.4 10*3/uL (ref 4.0–10.5)

## 2011-10-10 LAB — URINE MICROSCOPIC-ADD ON

## 2011-10-10 LAB — URINALYSIS, ROUTINE W REFLEX MICROSCOPIC
Glucose, UA: NEGATIVE mg/dL
Protein, ur: >300 mg/dL — AB
Urobilinogen, UA: 1 mg/dL (ref 0.0–1.0)

## 2011-10-10 LAB — PROTIME-INR: Prothrombin Time: 20.1 seconds — ABNORMAL HIGH (ref 11.6–15.2)

## 2011-10-10 LAB — POCT I-STAT TROPONIN I

## 2011-10-10 MED ORDER — SODIUM CHLORIDE 0.9 % IV SOLN
Freq: Once | INTRAVENOUS | Status: AC
  Administered 2011-10-10: 22:00:00 via INTRAVENOUS

## 2011-10-10 MED ORDER — METOPROLOL TARTRATE 12.5 MG HALF TABLET
12.5000 mg | ORAL_TABLET | Freq: Two times a day (BID) | ORAL | Status: DC
  Administered 2011-10-11 – 2011-10-13 (×6): 12.5 mg via ORAL
  Filled 2011-10-10 (×7): qty 1

## 2011-10-10 MED ORDER — IPRATROPIUM BROMIDE 0.02 % IN SOLN: 0.5000 mg | Freq: Four times a day (QID) | RESPIRATORY_TRACT | Status: DC | PRN

## 2011-10-10 MED ORDER — IOHEXOL 350 MG/ML SOLN
100.0000 mL | Freq: Once | INTRAVENOUS | Status: AC | PRN
  Administered 2011-10-10: 100 mL via INTRAVENOUS

## 2011-10-10 MED ORDER — ALBUTEROL SULFATE (5 MG/ML) 0.5% IN NEBU: 2.5000 mg | INHALATION_SOLUTION | Freq: Four times a day (QID) | RESPIRATORY_TRACT | Status: DC | PRN

## 2011-10-10 MED ORDER — FUROSEMIDE 10 MG/ML IJ SOLN
40.0000 mg | Freq: Once | INTRAMUSCULAR | Status: AC
  Administered 2011-10-10: 40 mg via INTRAVENOUS
  Filled 2011-10-10: qty 4

## 2011-10-10 NOTE — ED Notes (Signed)
Patient transported to CT 

## 2011-10-10 NOTE — ED Notes (Addendum)
Per ems pt has c/o of intermittent SOB x1 week. 1 week ago mobile xray was at house for sob. Daughter reports sob has increased in last few days. Pt also reports LLQ abdominal pain and left shoulder pain, left facial pain, constipation, and decreased appetite.  Pt denies cough, n/v/d. Pt alert and oriented to her norm. 100% on 3 L [place]. 98% on Room air

## 2011-10-10 NOTE — H&P (Addendum)
[facility] History and Physical  [name]:[id number] DOB: [birth year] DOA: 10/10/2011  Referring physician: ER physician PCP: Rogelia Boga, MD   Chief Complaint: shortness of breath  HPI:  78 year old female with past medical history of dementia, DVT (righ upper extremity), atrial fibrillation (on coumadin), wheelchair bound who presented with progressively worsening shortness of breath started 1 week prior to this admission with no associated cough or fever or chills. Patient has no complaints of chest pain, no abdominal pain, no nausea or vomiting. No lightheadedness or loss of consciousness.  Assessment/Plan:   Principal Problem:  *Shortness of breath  Secondary to combination of acute diastolic CHF exacerbation and COPD exacerbation  PE ruled out with negative CT chest angio  Start lasix 40 mg IV Daily with daily weight and strict intake and output  BP is at goal for this 78 year old female  order placed for 2 D ECHO per CHF order set protocol   Follow up cardiac enzymes x 3 sets  Nebulizer treatment PRN  Active Problems:  DVT of upper extremity (deep vein thrombosis)  Coumadin per pharmacy  INR sub therapeutic on this admission   Acute diastolic CHF (congestive heart failure)  Per management above  Moderate malnutrition/underweight (BMI 17.8)  Resource Breeze BID ordered.  Decreased mobility, functional quadriplegia  PT eval; small bed exercises if appropriate  Hypertension  Will start low dose metoprolol 12.5 mg BID provided BP tolerates  Dementia  Stable. Wheelchair bound at baseline.  Normocytic anemia  Likely AOCD. Mild.  H&H stable.  Code Status: Full Family Communication: Pt at bedside Disposition Plan: Admit for further evaluation  Manson Passey, MD  [facility] Pager [phone]  If 7PM-7AM, please contact night-coverage [email or web address] Password [email or web address] 10/10/2011, 11:46 PM   Review of Systems:    Constitutional: Negative for fever, chills and malaise/fatigue. Negative for diaphoresis.  HENT: Negative for hearing loss, ear pain, nosebleeds, congestion, sore throat, neck pain, tinnitus and ear discharge.   Eyes: Negative for blurred vision, double vision, photophobia, pain, discharge and redness.  Respiratory: Negative for cough, hemoptysis, sputum production, positive for shortness of breath, no wheezing  Cardiovascular: Negative for chest pain, palpitations, orthopnea, claudication and leg swelling.  Gastrointestinal: Negative for nausea, vomiting and abdominal pain. Negative for heartburn, constipation, blood in stool and melena.  Genitourinary: Negative for dysuria, urgency, frequency, hematuria and flank pain.  Musculoskeletal: Negative for myalgias, back pain, joint pain and falls.  Skin: Negative for itching and rash.  Neurological: Negative for dizziness and weakness. Negative for tingling, tremors, sensory change, speech change, focal weakness, loss of consciousness and headaches.  Endo/Heme/Allergies: Negative for environmental allergies and polydipsia. Does not bruise/bleed easily.  Psychiatric/Behavioral: Negative for suicidal ideas. The patient is not nervous/anxious.      Past Medical History  Diagnosis Date  . High cholesterol   . Heart block   . Ulcer     chronic right leg status ulcer.   Marland Kitchen HTN (hypertension)    Past Surgical History  Procedure Date  . Permanent pacemaker    Social History:  reports that she has never smoked. Her smokeless tobacco use includes Snuff. She reports that she does not drink alcohol or use illicit drugs.  No Known Allergies  Family History: heart disease in parents, dementia in mother  Prior to Admission medications   Medication Sig Start Date End Date Taking? Authorizing Provider  feeding supplement (ENSURE) PUDG Take 1 Container by mouth 2 (two) times daily between meals. 08/19/11  Yes Alison Murray, MD  feeding supplement  (RESOURCE BREEZE) LIQD Take 1 Container by mouth 2 (two) times daily between meals. 08/05/11  Yes Joseph Art, DO  warfarin (COUMADIN) 1 MG tablet Take 1 mg by mouth daily.  08/22/11 08/21/12 Yes Baker Pierini, FNP   Physical Exam: Filed Vitals:   10/10/11 1905 10/10/11 2058 10/10/11 2250 10/10/11 2345  BP: 153/80 144/66    Pulse: 70 70    Temp:   98.5 F (36.9 C)   TempSrc:   Oral   Resp:  16    Height:    5\' 4"  (1.626 m)  Weight:    49.896 kg (110 lb)  SpO2: 99% 98%      Physical Exam  Constitutional: Appears ill, no acute distress HENT: Normocephalic. No tonsillar erythema or exudates Eyes: Conjunctivae and EOM are normal. PERRLA Neck: Normal ROM. Neck supple. Thyroid not palpably enlarged CVS: RRR, S1/S2 +, no murmurs, no gallops, no carotid bruit.  Pulmonary: diminished breath sounds bilaterally, no rhonchi or wheezing appreciated Abdominal: Soft. BS +,  no distension, tenderness, rebound or guarding.  Musculoskeletal: LE edema, no tenderness  Lymphadenopathy: No lymphadenopathy noted, cervical, inguinal. Neuro: no focal neurologic deficits, awake Skin: Skin is warm and dry.   Labs on Admission:  Basic Metabolic Panel:  Lab 10/10/11 1610  NA 144  K 3.2*  CL 107  CO2 24  GLUCOSE 111*  BUN 19  CREATININE 0.64  CALCIUM 9.6   CBC:  Lab 10/10/11 2020  WBC 7.4  HGB 11.9*  HCT 36.5  MCV 84.7  PLT 244    Radiological Exams on Admission: Dg Chest 2 View 10/10/2011  *  IMPRESSION: Small pleural effusions, otherwise stable chronic cardiomegaly and no other acute cardiopulmonary abnormality.      Ct Angio Chest Pe W/cm &/or Wo Cm 10/10/2011    IMPRESSION: No evidence of pulmonary embolism. Atherosclerotic changes with aneurysmal dilatation of the ascending and descending thoracic aorta. Enlarged central pulmonary arteries question pulmonary arterial hypertension. Enlargement of cardiac chambers particularly the atria. Changes of COPD with bibasilar atelectasis and  small bilateral pleural effusions.       EKG: not done on  admission  Time spent: 95 minutes

## 2011-10-10 NOTE — ED Provider Notes (Addendum)
History     CSN: [id number]  Arrival date & time 10/10/11  1859   First MD Initiated Contact with Patient 10/10/11 1938      Chief Complaint  Patient presents with  . Shortness of Breath    (Consider location/radiation/quality/duration/timing/severity/associated sxs/prior treatment) Patient is a 78 y.o. female presenting with shortness of breath. The history is provided by the patient.  Shortness of Breath  The current episode started more than 1 week ago. The onset was gradual. The problem has been unchanged. The problem is mild. Nothing relieves the symptoms. Nothing aggravates the symptoms. Associated symptoms include rhinorrhea and shortness of breath. Pertinent negatives include no chest pain, no fever and no cough.  Pt had an outpatient xray one week ago.  In the last few days family states it has gotten worse.  Intermittently she has expressed pain in the left shoulder as well as decreased appetite and constipation.  Pt herself denies any complaints and states she feels fine.  Family states however that she has not eaten well today and the last few days she has been short of breath.    She has been pursing her lips and breathing.  Past Medical History  Diagnosis Date  . High cholesterol   . Heart block   . Ulcer     chronic right leg status ulcer.   Marland Kitchen HTN (hypertension)     Past Surgical History  Procedure Date  . Permanent pacemaker     Family History  Problem Relation Age of Onset  . Cancer Son   . Heart failure Daughter     History  Substance Use Topics  . Smoking status: Never Smoker   . Smokeless tobacco: Current User    Types: Snuff  . Alcohol Use: No    OB History    Grav Para Term Preterm Abortions TAB SAB Ect Mult Living                  Review of Systems  Constitutional: Negative for fever.  HENT: Positive for rhinorrhea.   Respiratory: Positive for shortness of breath. Negative for cough.   Cardiovascular: Negative for chest pain.    Gastrointestinal: Positive for constipation.  All other systems reviewed and are negative.    Allergies  Review of patient's allergies indicates no known allergies.  Home Medications   Current Outpatient Rx  Name Route Sig Dispense Refill  . ENSURE PUDDING PO PUDG Oral Take 1 Container by mouth 2 (two) times daily between meals.    . RESOURCE BREEZE PO LIQD Oral Take 1 Container by mouth 2 (two) times daily between meals.    . WARFARIN SODIUM 1 MG PO TABS Oral Take 1 mg by mouth daily.       BP 153/80  Pulse 70  SpO2 99%  Physical Exam  Nursing note and vitals reviewed. Constitutional: No distress.       Elderly  HENT:  Head: Normocephalic and atraumatic.  Right Ear: External ear normal.  Left Ear: External ear normal.  Eyes: Conjunctivae are normal. Right eye exhibits no discharge. Left eye exhibits no discharge. No scleral icterus.  Neck: Neck supple. No tracheal deviation present.  Cardiovascular: Normal rate, regular rhythm and intact distal pulses.   Pulmonary/Chest: Effort normal and breath sounds normal. No stridor. No respiratory distress. She has no wheezes. She has no rales.  Abdominal: Soft. Bowel sounds are normal. She exhibits no distension. There is no tenderness. There is no rebound and no guarding.  Musculoskeletal: She exhibits edema (edema of right lower extremity, chronic healing wound right foot). She exhibits no tenderness.       Kyphosis  Neurological: She is alert. She has normal strength. No sensory deficit. Cranial nerve deficit:  no gross defecits noted. She exhibits normal muscle tone. She displays no seizure activity. Coordination normal.  Skin: Skin is warm and dry. No rash noted.  Psychiatric: She has a normal mood and affect.    ED Course  Procedures (including critical care time)  Rate: 70  Rhythm: Electronically paced  QRS Axis: Left  Intervals: Electronically paced  ST/T Wave abnormalities: normal  Conduction Disutrbances:  Electronically paced  Narrative Interpretation: Electronically paced   Old EKG Reviewed: No changes  Labs Reviewed  PRO B NATRIURETIC PEPTIDE - Abnormal; Notable for the following:    Pro B Natriuretic peptide (BNP) 9771.0 (*)     All other components within normal limits  BASIC METABOLIC PANEL - Abnormal; Notable for the following:    Potassium 3.2 (*)     Glucose, Bld 111 (*)     GFR calc non Af Amer 76 (*)     GFR calc Af Amer 88 (*)     All other components within normal limits  CBC - Abnormal; Notable for the following:    Hemoglobin 11.9 (*)     RDW 17.3 (*)     All other components within normal limits  D-DIMER, QUANTITATIVE - Abnormal; Notable for the following:    D-Dimer, Quant 1.54 (*)     All other components within normal limits  URINALYSIS, ROUTINE W REFLEX MICROSCOPIC - Abnormal; Notable for the following:    Color, Urine AMBER (*)  BIOCHEMICALS MAY BE AFFECTED BY COLOR   APPearance CLOUDY (*)     Hgb urine dipstick MODERATE (*)     Bilirubin Urine MODERATE (*)     Ketones, ur TRACE (*)     Protein, ur >300 (*)     Leukocytes, UA TRACE (*)     All other components within normal limits  PROTIME-INR - Abnormal; Notable for the following:    Prothrombin Time 20.1 (*)     INR 1.68 (*)     All other components within normal limits  URINE MICROSCOPIC-ADD ON - Abnormal; Notable for the following:    Squamous Epithelial / LPF FEW (*)     Bacteria, UA MANY (*)     Casts HYALINE CASTS (*)     All other components within normal limits  POCT I-STAT TROPONIN I   Dg Chest 2 View  10/10/2011  *RADIOLOGY REPORT*  Clinical Data: 78 year old female with weakness and shortness of breath.  CHEST - 2 VIEW  Comparison: 08/03/2011 and earlier.  Findings: Chronic cardiomegaly.  Stable mediastinal contours. Stable right chest single lead cardiac pacemaker.  No pneumothorax or pulmonary edema.  There are small pleural effusions.  No consolidation.  Osteopenia. Visualized tracheal air  column is within normal limits.  IMPRESSION: Small pleural effusions, otherwise stable chronic cardiomegaly and no other acute cardiopulmonary abnormality.   Original Report Authenticated By: Harley Hallmark, M.D.    Ct Angio Chest Pe W/cm &/or Wo Cm  10/10/2011  *RADIOLOGY REPORT*  Clinical Data: Shortness of breath question pulmonary embolism, left lower quadrant pain, left shoulder and facial pain; history hypertension  CT ANGIOGRAPHY CHEST  Technique:  Multidetector CT imaging of the chest using the standard protocol during bolus administration of intravenous contrast. Multiplanar reconstructed images including MIPs were obtained and  reviewed to evaluate the vascular anatomy.  Contrast: OMNIPAQUE IOHEXOL 350 MG/ML SOLN  Comparison: None  Findings: Atherosclerotic calcification aorta with aneurysmal dilatation of ascending thoracic aorta 4.3 x 4.5 cm image 42. Enlarged central pulmonary arteries. Pulmonary vascular cephalization. Scattered respiratory motion artifacts present at the lower lobes, creating artifacts at pulmonary arteries especially in the left lower lobe. Bibasilar atelectasis. Tortuous descending thoracic aorta with aneurysmal dilatation, aorta measuring 3.6 x 3.5 cm diameter at the aortic hiatus. No definite pulmonary emboli identified. Small bilateral pleural effusions.  Reflux of contrast air into the IVC and hepatic veins. Small cysts left lobe liver. Emphysematous and bronchitic changes compatible with COPD. Pacemaker pack right chest with leads in right atrium and right ventricle. Heart is enlarged with significant enlargement of the atria. Remaining lungs clear. Bones severely demineralized. Cervicothoracic kyphosis and question thoracic ankylosis.  IMPRESSION: No evidence of pulmonary embolism. Atherosclerotic changes with aneurysmal dilatation of the ascending and descending thoracic aorta. Enlarged central pulmonary arteries question pulmonary arterial hypertension. Enlargement of  cardiac chambers particularly the atria. Changes of COPD with bibasilar atelectasis and small bilateral pleural effusions.   Original Report Authenticated By: Lollie Marrow, M.D.       MDM  Patient presents with progressive shortness of breath.  Chest x-ray shows a mild pleural effusion however she has an elevated BNP suggesting component of congestive heart failure. Patient's INR is subtherapeutic and she does have an elevated d-dimer. I will proceed with a CT scan of the chest rule out pulmonary embolism. Regardless the patient was admitted for her dyspnea. If her CT scan is negative the most likely diagnosis is pulmonary edema/CHF.    Celene Kras, MD 10/10/11 [id number]

## 2011-10-10 NOTE — ED Notes (Signed)
OZH:[id number]<HQ> Expected date:<BR> Expected time:<BR> Means of arrival:<BR> Comments:<BR> sob

## 2011-10-11 ENCOUNTER — Encounter: Payer: Self-pay | Admitting: Internal Medicine

## 2011-10-11 ENCOUNTER — Encounter (HOSPITAL_COMMUNITY): Payer: Self-pay | Admitting: Internal Medicine

## 2011-10-11 DIAGNOSIS — R001 Bradycardia, unspecified: Secondary | ICD-10-CM | POA: Insufficient documentation

## 2011-10-11 DIAGNOSIS — I379 Nonrheumatic pulmonary valve disorder, unspecified: Secondary | ICD-10-CM

## 2011-10-11 DIAGNOSIS — I509 Heart failure, unspecified: Secondary | ICD-10-CM

## 2011-10-11 DIAGNOSIS — M40209 Unspecified kyphosis, site unspecified: Secondary | ICD-10-CM | POA: Insufficient documentation

## 2011-10-11 DIAGNOSIS — I4891 Unspecified atrial fibrillation: Secondary | ICD-10-CM

## 2011-10-11 LAB — CBC WITH DIFFERENTIAL/PLATELET
Basophils Absolute: 0.1 10*3/uL (ref 0.0–0.1)
Basophils Relative: 1 % (ref 0–1)
Hemoglobin: 12.1 g/dL (ref 12.0–15.0)
MCHC: 32.4 g/dL (ref 30.0–36.0)
Neutro Abs: 4.2 10*3/uL (ref 1.7–7.7)
Neutrophils Relative %: 62 % (ref 43–77)
RDW: 17.3 % — ABNORMAL HIGH (ref 11.5–15.5)

## 2011-10-11 LAB — COMPREHENSIVE METABOLIC PANEL
AST: 19 U/L (ref 0–37)
Albumin: 3.3 g/dL — ABNORMAL LOW (ref 3.5–5.2)
Alkaline Phosphatase: 54 U/L (ref 39–117)
Chloride: 104 mEq/L (ref 96–112)
Potassium: 3.6 mEq/L (ref 3.5–5.1)
Sodium: 142 mEq/L (ref 135–145)
Total Bilirubin: 0.6 mg/dL (ref 0.3–1.2)

## 2011-10-11 LAB — TROPONIN I: Troponin I: <0.3 ng/mL (ref <0.30)

## 2011-10-11 LAB — PROTIME-INR: Prothrombin Time: 20.2 seconds — ABNORMAL HIGH (ref 11.6–15.2)

## 2011-10-11 LAB — PRO B NATRIURETIC PEPTIDE: Pro B Natriuretic peptide (BNP): 15820 pg/mL — ABNORMAL HIGH (ref 0–450)

## 2011-10-11 LAB — TSH: TSH: 1.017 u[IU]/mL (ref 0.350–4.500)

## 2011-10-11 MED ORDER — ENSURE PUDDING PO PUDG
1.0000 | Freq: Two times a day (BID) | ORAL | Status: DC
  Administered 2011-10-11: 1 via ORAL
  Filled 2011-10-11 (×2): qty 1

## 2011-10-11 MED ORDER — SODIUM CHLORIDE 0.9 % IJ SOLN: 3.0000 mL | INTRAMUSCULAR | Status: DC | PRN

## 2011-10-11 MED ORDER — ENSURE PUDDING PO PUDG
1.0000 | Freq: Two times a day (BID) | ORAL | Status: DC
  Administered 2011-10-12 – 2011-10-13 (×3): 1 via ORAL
  Filled 2011-10-11 (×4): qty 1

## 2011-10-11 MED ORDER — PRO-STAT SUGAR FREE PO LIQD
30.0000 mL | Freq: Two times a day (BID) | ORAL | Status: DC
  Administered 2011-10-11 – 2011-10-13 (×4): 30 mL via ORAL
  Filled 2011-10-11 (×6): qty 30

## 2011-10-11 MED ORDER — POTASSIUM CHLORIDE CRYS ER 20 MEQ PO TBCR
40.0000 meq | EXTENDED_RELEASE_TABLET | Freq: Once | ORAL | Status: AC
  Administered 2011-10-11: 40 meq via ORAL
  Filled 2011-10-11: qty 2

## 2011-10-11 MED ORDER — WARFARIN - PHARMACIST DOSING INPATIENT: Freq: Every day | Status: DC

## 2011-10-11 MED ORDER — ENSURE PUDDING PO PUDG: 2.0000 | Freq: Two times a day (BID) | ORAL | Status: DC

## 2011-10-11 MED ORDER — FUROSEMIDE 10 MG/ML IJ SOLN
40.0000 mg | Freq: Every day | INTRAMUSCULAR | Status: DC
  Filled 2011-10-11: qty 4

## 2011-10-11 MED ORDER — WARFARIN SODIUM 1 MG PO TABS
1.5000 mg | ORAL_TABLET | Freq: Once | ORAL | Status: AC
  Administered 2011-10-11: 1.5 mg via ORAL
  Filled 2011-10-11: qty 1

## 2011-10-11 MED ORDER — ONDANSETRON HCL 4 MG/2ML IJ SOLN: 4.0000 mg | Freq: Four times a day (QID) | INTRAMUSCULAR | Status: DC | PRN

## 2011-10-11 MED ORDER — BOOST / RESOURCE BREEZE PO LIQD
1.0000 | Freq: Two times a day (BID) | ORAL | Status: DC
  Administered 2011-10-11: 1 via ORAL

## 2011-10-11 MED ORDER — BIOTENE DRY MOUTH MT LIQD
15.0000 mL | Freq: Two times a day (BID) | OROMUCOSAL | Status: DC
  Administered 2011-10-11 – 2011-10-13 (×5): 15 mL via OROMUCOSAL

## 2011-10-11 MED ORDER — FUROSEMIDE 10 MG/ML IJ SOLN
20.0000 mg | Freq: Two times a day (BID) | INTRAMUSCULAR | Status: DC
  Administered 2011-10-11 – 2011-10-12 (×4): 20 mg via INTRAVENOUS
  Filled 2011-10-11 (×7): qty 2

## 2011-10-11 MED ORDER — ACETAMINOPHEN 325 MG PO TABS: 650.0000 mg | ORAL_TABLET | ORAL | Status: DC | PRN

## 2011-10-11 MED ORDER — ENSURE COMPLETE PO LIQD
237.0000 mL | Freq: Two times a day (BID) | ORAL | Status: DC
  Administered 2011-10-11 – 2011-10-13 (×5): 237 mL via ORAL

## 2011-10-11 MED ORDER — SODIUM CHLORIDE 0.9 % IV SOLN: 250.0000 mL | INTRAVENOUS | Status: DC | PRN

## 2011-10-11 MED ORDER — ENOXAPARIN SODIUM 40 MG/0.4ML ~~LOC~~ SOLN
40.0000 mg | SUBCUTANEOUS | Status: DC
  Administered 2011-10-11 – 2011-10-12 (×2): 40 mg via SUBCUTANEOUS
  Filled 2011-10-11 (×3): qty 0.4

## 2011-10-11 MED ORDER — HYDROCERIN EX CREA
TOPICAL_CREAM | Freq: Every day | CUTANEOUS | Status: DC
  Administered 2011-10-11 – 2011-10-13 (×3): via TOPICAL
  Filled 2011-10-11: qty 113

## 2011-10-11 MED ORDER — SODIUM CHLORIDE 0.9 % IJ SOLN
3.0000 mL | Freq: Two times a day (BID) | INTRAMUSCULAR | Status: DC
  Administered 2011-10-11 – 2011-10-13 (×5): 3 mL via INTRAVENOUS

## 2011-10-11 NOTE — Telephone Encounter (Signed)
LMOM for return call

## 2011-10-11 NOTE — Progress Notes (Signed)
ANTICOAGULATION CONSULT NOTE - Initial Consult  Pharmacy Consult for Warfarin Indication: atrial fibrillation / Hx of RUE DVT  No Known Allergies  Patient Measurements: Height: 5\' 4"  (162.6 cm) Weight: 110 lb (49.896 kg) IBW/kg (Calculated) : 54.7    Vital Signs: Temp: 98.5 F (36.9 C) (09/02 2250) Temp src: Oral (09/02 2250) BP: 144/66 mmHg (09/02 2058) Pulse Rate: 70  (09/02 2058)  Labs:  Basename 10/10/11 2020  HGB 11.9*  HCT 36.5  PLT 244  APTT --  LABPROT 20.1*  INR 1.68*  HEPARINUNFRC --  CREATININE 0.64  CKTOTAL --  CKMB --  TROPONINI --    Estimated Creatinine Clearance: 36.1 ml/min (by C-G formula based on Cr of 0.64).   Medical History: Past Medical History  Diagnosis Date  . High cholesterol   . Heart block   . Ulcer     chronic right leg status ulcer.   Marland Kitchen HTN (hypertension)     Medications:  Scheduled:    . sodium chloride   Intravenous Once  . furosemide  40 mg Intravenous Once  . metoprolol tartrate  12.5 mg Oral BID   Infusions:    Assessment: 78 yo female c/o SOB secondary to acute diastolic CHF and COPD exacerbation.  Pt is on chronic coumadin for history of A-fib and RUE DVT.  Pt received 1mg  9/2 per Med Rec. Goal of Therapy:  INR 2-3    Plan:   Daily PT/INR  Lorenza Evangelist 10/11/2011,12:14 AM

## 2011-10-11 NOTE — Progress Notes (Signed)
Pt came up from the ED with clothes, multiple pads and bedding completely soaked. Per report by the ED nurse, pt is able to communicate the need to urinate and uses the bed pan. According to pt's daughter, pt is not able to tell staff the need to urinate or to have a bowel movement. By previous electronic order by MD Elisabeth Pigeon, I went ahead and placed a foley catheter to be able to maintain strict Is and Os. After a bath and linen change, the pt is resting comfortably in bed.

## 2011-10-11 NOTE — Progress Notes (Deleted)
Initial HR Initial BP Initial MAP Initial RR Initial O2 sat  HR with activity BP with activity MAP with activity RR with activity O2 sat with activity  Post HR Post BP Post MAP Post RR Post O2 sat  Patient on ____liters O2. Patient on ____Trach collar. Patient on ________ventilator. PEEP_____.   FiO2_______.   PT/OT/ST Cancellation Note  ___Treatment cancelled today due to medical issues with patient which prohibited therapy  ___ Treatment cancelled today due to patient receiving procedure or test   ___ Treatment cancelled today due to patient's refusal to participate   ___ Treatment cancelled today due to   Signature:    PT/OT/ST Cancellation Note  ___Treatment cancelled today due to medical issues with patient which prohibited therapy  ___ Treatment cancelled today due to patient receiving procedure or test   ___ Treatment cancelled today due to patient's refusal to participate   ___ Treatment cancelled today due to   Signature:    PT Cancellation Note  ___Treatment cancelled today due to medical issues with patient which prohibited   therapy  ___ Treatment cancelled today due to patient receiving procedure or test   ___ Treatment cancelled today due to patient's refusal to participate   __x_ Treatment cancelled today due to pt's BP low and rr low Blanchard Kelch PT.

## 2011-10-11 NOTE — Consult Note (Signed)
WOC consult Note Reason for Consult: eval wound lower left leg, I do not see any open wounds of the left leg. She does however have some scarring on the RLE from previous wounds.  She was seen per podiatry for foot and leg wounds in the past and has recently been evaluated by the [facility] wound care center for same.  At that time the wound care center reported to pts family the wounds are all healed and they only needed to keep dry skin moisturized to prevent areas opening again.  Today same found by this WOC nurse, no real open areas. She does have some scarring and some peeling skin of the right malleolar region.  I have suggest emmollient to be used at home to keep this skin moist.   Wound type: no open wounds at this time, she has some peeling skin with re epithelialized areas under this. I will order Eucerin for application to bilateral LE to keep the skin softened.   Re consult if needed, will not follow at this time. Thanks  Charlee White[employer], CWOCN [phone])

## 2011-10-11 NOTE — Progress Notes (Signed)
*  PRELIMINARY RESULTS* Echocardiogram 2D Echocardiogram has been performed.  [name] 10/11/2011, 11:29 AM

## 2011-10-11 NOTE — Consult Note (Signed)
CARDIOLOGY CONSULT NOTE  Patient ID: [name] MRN: [id number], DOB/AGE: 1918/08/03   Admit date: 10/10/2011 Date of Consult: 10/11/2011   Primary Physician: Rogelia Boga, MD Primary Cardiologist: Reece Agar. Ladona Ridgel, MD  Pt. Profile  78 y/o female with h/o afib and symptomatic bradycardia s/p ppm in 2005, who presented with a 1 wk h/o dyspnea.  Problem List  Past Medical History  Diagnosis Date  . Hyperlipidemia   . Chronic ulcer of right leg   . HTN (hypertension)   . Symptomatic bradycardia     a. 08/08/2003 s/p MDT [id number] Sigma 300 PPM (VVI), Ser # [id number] H.  . Kyphosis     severe  . Atrial fibrillation   . Dementia   . History of DVT (deep vein thrombosis)     a. 08/2011 acute Right brachial, axillary, and subclavian DVTs.  . Normocytic anemia   . Physical deconditioning     a. wheelchair bound    Past Surgical History  Procedure Date  . Permanent pacemaker     Allergies  No Known Allergies  HPI   78 y/o female with the above problem list.  She was in her USOH until approximately 1 wk ago when she began to experience progressive dyspnea.  She is sedentary at home, spending much of her day in her recliner.  Her dtr and son assist her with all transfers though she has been working with physical therapy.  She has been experiencing dyspnea with any activity and her dtr also noticed her to huff and puff while sitting @ rest.  She has chronic 2+ pillow orthopnea, which hasn't changed, and denies pnd or edema.  Her appetite has fallen off some in the past few day.  She does not weigh herself regularly though her dtr notes that @ last PCP visit she weighed roughly 110lbs.  Due to dyspnea, pt presented to the ED yesterday.  Pro-BNP was elevated @ 9771.0.  Though d-dimer was elevated, Chest CT did not show PE.  CXR did not show significant edema.  INR was subRx.  She was admitted and placed on IV lasix.  Weight @ 23:45 last night was recorded @ 110lbs.  Just 3 hrs later, @  02:43, her weight is recorded @ 104 lbs. She has had a net negative of 1.5 L since last night.  She currently reports feeling much better.  Inpatient Medications  . sodium chloride   Intravenous Once  . antiseptic oral rinse  15 mL Mouth Rinse BID  . enoxaparin (LOVENOX) injection  40 mg Subcutaneous Q24H  . feeding supplement  1 Container Oral BID BM  . feeding supplement  1 Container Oral BID BM  . furosemide  20 mg Intravenous Q12H  . furosemide  40 mg Intravenous Once  . metoprolol tartrate  12.5 mg Oral BID   Family History Family History  Problem Relation Age of Onset  . Cancer Son   . Heart failure Daughter   . Other Mother     Deceased "55's" - Old Age  . Other Father     Deceased "54's" - homicide    Social History History   Social History  . Marital Status: Single    Spouse Name: N/A    Number of Children: N/A  . Years of Education: N/A   Occupational History  . Not on file.   Social History Main Topics  . Smoking status: Never Smoker   . Smokeless tobacco: Current User    Types: Snuff  . Alcohol  Use: No  . Drug Use: No  . Sexually Active: No   Other Topics Concern  . Not on file   Social History Narrative   Widowed.  Lives in [place] with daughter.  Limited mobility.  Spends most of her day in recliner.  Dtr and Son help with transfers.  Working with PT to learn to use walker.  Uses wheelchair outside of the house.    Review of Systems  General:  No chills, fever, night sweats or weight changes.  Cardiovascular:  +++DOE and Dyspnea @ rest over the past week.  Though she has not had chest pain over the past week, or even prior to that, when asked, she says that she has had a few fleeting episodes of chest pain since hospitalization.  She has chronic, mild RLE edema in the setting of chronic RLE ulceration.  She has chronic 2+ pillow orthopnea.  She denies palpitations, paroxysmal nocturnal dyspnea. Dermatological: No rash, lesions/masses Respiratory: No  cough.  +++ dyspnea as above. Urologic: No hematuria, dysuria Abdominal:   No nausea, vomiting, diarrhea, bright red blood per rectum, melena, or hematemesis Neurologic:  No visual changes, wkns, changes in mental status. All other systems reviewed and are otherwise negative except as noted above.  Physical Exam  Blood pressure 138/81, pulse 70, temperature 96.9 F (36.1 C), temperature source Axillary, resp. rate 16, height 5\' 4"  (1.626 m), weight 104 lb 11.2 oz (47.492 kg), SpO2 99.00%.  General: Pleasant, NAD Psych: Normal affect. Neuro: Alert and oriented X 3. Moves all extremities spontaneously. HEENT: Normal  Neck: Supple without bruits.  JVP 8 cm  Lungs:  Resp regular and unlabored.  Diminished breath sounds in bilat bases with crackles noted, L>R. Heart: RRR no s3, s4.  Soft systolic/diastolic murmurs heard @ apex. Abdomen: Soft, non-tender, non-distended, BS + x 4.  Extremities: No clubbing, cyanosis.  Trace R LE edema.  R ankle wrapped in gauze.  No edema on left.  DP/PT/Radials 1+ and equal bilaterally.  Labs   Basename 10/11/11 0825 10/11/11 0315  CKTOTAL -- --  CKMB -- --  TROPONINI <0.30 <0.30   Lab Results  Component Value Date   WBC 6.8 10/11/2011   HGB 12.1 10/11/2011   HCT 37.4 10/11/2011   MCV 83.7 10/11/2011   PLT 207 10/11/2011     Lab 10/11/11 0315  NA 142  K 3.6  CL 104  CO2 27  BUN 18  CREATININE 0.69  CALCIUM 9.7  PROT 7.4  BILITOT 0.6  ALKPHOS 54  ALT 7  AST 19  GLUCOSE 92    Lab Results  Component Value Date   DDIMER 1.54* 10/10/2011   pBNP 9771.0 (9/2) --> 15,820 (9/3).  Radiology/Studies  Dg Chest 2 View  10/10/2011  *RADIOLOGY REPORT*  Clinical Data: 78 year old female with weakness and shortness of breath.  CHEST - 2 VIEW  IMPRESSION: Small pleural effusions, otherwise stable chronic cardiomegaly and no other acute cardiopulmonary abnormality.   Original Report Authenticated By: Harley Hallmark, M.D.    Ct Angio Chest Pe W/cm &/or Wo  Cm  10/10/2011  *RADIOLOGY REPORT*  Clinical Data: Shortness of breath question pulmonary embolism, left lower quadrant pain, left shoulder and facial pain; history hypertension  CT ANGIOGRAPHY CHEST  IMPRESSION: No evidence of pulmonary embolism. Atherosclerotic changes with aneurysmal dilatation of the ascending and descending thoracic aorta. Enlarged central pulmonary arteries question pulmonary arterial hypertension. Enlargement of cardiac chambers particularly the atria. Changes of COPD with bibasilar atelectasis and small  bilateral pleural effusions.   Original Report Authenticated By: Lollie Marrow, M.D.    ECG  V Paced, 70, underlying afib.  ASSESSMENT AND PLAN  1.  Acute CHF:  Presumably diastolic.  Echo is currently pending.  She did have some HTN upon admission and this has improved some.  Her volume looks pretty good and I suspect she will not require IV lasix beyond today.  Her weights appear to be inaccurate.  Will review echo.  Cont bb and current lasix dose for now.  2.  Chronic afib:  She is 100% paced/captured @ a rate of 70.  Upon last PPM check 09/2010, she had roughly 2 1/2 yrs of battery life left.  Because of pts debilitated status, I see in phone notes, that family has been concerned about their ability to get her to the office for pacer check.  We will have MDT rep see her while she is here to check device.  Of note, she is on coumadin, but this was just recently started in setting of acute RUE DVT in July.  3.  RUE DVT:  SubRx INR.  Pharmacy adjusting.  4.  Chest pain:  She describes "a few" episodes of fleeting chest discomfort since hospitalization.  Atypical.  CE negative.  No further w/u.  Cont bb.  5.  HTN:  Currently stable.  Signed, Nicolasa Ducking, NP 10/11/2011, 9:54 AM  Patient seen with NP, agree with note.  Patient has chronic atrial fibrillation and presented with acute on chronic presumed diastolic CHF.  She had run out of po Lasix for about 5 days.  She  feels much better with diuresis.  She does not appear markedly overloaded at this time.  - Would continue IV Lasix today, probably change over to po tomorrow.  Would make sure she has refills on po Lasix at discharge and follows up with cardiology.  - She is on coumadin for a DVT, not sure I would continue her long-term given fall risk.    Marca Ancona 10/11/2011 10:47 AM

## 2011-10-11 NOTE — Progress Notes (Signed)
[facility] PROGRESS NOTE  [place] WGN:[id number] DOB: 1918-09-22 DOA: 10/10/2011 PCP: Rogelia Boga, MD  Assessment/Plan: Principal Problem:  *Acute diastolic CHF (congestive heart failure) Active Problems:  HYPERTENSION  Dementia  DVT of upper extremity (deep vein thrombosis)  Normocytic anemia  Decreased mobility  Underweight (BMI 17.8)  Atrial fibrillation  Shortness of breath  Hypokalemia  #1 acute diastolic CHF exacerbation Cardiac enzymes are pending. 2-D echo is pending. BNP elevated on admission at 9771. Continue strict I.'s and O.'s and daily weights. Change Lasix to 20 mg IV every 12 hours. Continue low-dose beta blocker. Will consult with cardiology for further evaluation and management.  #2 shortness of breath Likely secondary to problem #1. CT angiogram of the chest is negative for PE. Also negative for infiltrate. Patient does not have any wheezing and no signs of COPD exacerbation. See problem #1.  #3 history of upper extremity DVT INR is subtherapeutic. On Coumadin per pharmacy.  #4 hypertension Patient has been started on low-dose beta blocker will follow.  #5 normocytic anemia Follow H&H  #6 dementia Stable.  #7 moderate malnutrition/underweight Continue resource breeze.  #8 hypokalemia Repleted.  #9 prophylaxis Lovenox for DVT prophylaxis. When INR becomes therapeutic we'll discontinue Lovenox.   Code Status: Full Family Communication: The patient and daughter at bedside Disposition Plan: Home when medically stable.   Brief narrative: 78 year old female with past medical history of dementia, DVT (righ upper extremity), atrial fibrillation (on coumadin), wheelchair bound who presented with progressively worsening shortness of breath started 1 week prior to this admission with no associated cough or fever or chills. Patient has no complaints of chest pain, no abdominal pain, no nausea or vomiting. No lightheadedness or loss  of consciousness.      Consultants Cardiology: Corinda Gubler pending 10/11/2011  Procedures:  Chest x-ray 10/10/2011  CT angiogram of the chest 10/10/2011  Antibiotics:  none  HPI/Subjective: Patient states unable to state as to whether her shortness of breath has improved since admission. Patient denies any chest pain.  Objective: Filed Vitals:   10/11/11 0138 10/11/11 0243 10/11/11 0309 10/11/11 0740  BP: 139/76  144/73 138/81  Pulse: 70  70 70  Temp:   97.8 F (36.6 C) 96.9 F (36.1 C)  TempSrc:   Oral Axillary  Resp:   18 16  Height:  5\' 4"  (1.626 m)    Weight:  47.492 kg (104 lb 11.2 oz)    SpO2: 99%  100% 99%    Intake/Output Summary (Last 24 hours) at 10/11/11 0853 Last data filed at 10/11/11 0648  Gross per 24 hour  Intake     30 ml  Output   1500 ml  Net  -1470 ml   Filed Weights   10/10/11 2345 10/11/11 0243  Weight: 49.896 kg (110 lb) 47.492 kg (104 lb 11.2 oz)    Exam: General: Alert, awake, oriented x3, in no acute distress. Severe kyphosis HEENT: No bruits, no goiter. Heart: Regular rate and rhythm, without murmurs, rubs, gallops. Lungs: Decreased breath sounds in the bases. No wheezing. Abdomen: Soft, nontender, nondistended, positive bowel sounds. Extremities: No clubbing cyanosis. Trace BLE edema with positive pedal pulses. Neuro: Grossly intact, nonfocal.  Data Reviewed: Basic Metabolic Panel:  Lab 10/11/11 7846 10/10/11 2020  NA 142 144  K 3.6 3.2*  CL 104 107  CO2 27 24  GLUCOSE 92 111*  BUN 18 19  CREATININE 0.69 0.64  CALCIUM 9.7 9.6  MG 1.7 --  PHOS -- --  Liver Function Tests:  Lab 10/11/11 0315  AST 19  ALT 7  ALKPHOS 54  BILITOT 0.6  PROT 7.4  ALBUMIN 3.3*   No results found for this basename: LIPASE:5,AMYLASE:5 in the last 168 hours No results found for this basename: AMMONIA:5 in the last 168 hours CBC:  Lab 10/11/11 0315 10/10/11 2020  WBC 6.8 7.4  NEUTROABS 4.2 --  HGB 12.1 11.9*  HCT 37.4 36.5  MCV 83.7  84.7  PLT 207 244   Cardiac Enzymes:  Lab 10/11/11 0315  CKTOTAL --  CKMB --  CKMBINDEX --  TROPONINI <0.30   BNP (last 3 results)  Basename 10/10/11 2020  PROBNP 9771.0*   CBG: No results found for this basename: GLUCAP:5 in the last 168 hours  No results found for this or any previous visit (from the past 240 hour(s)).   Studies: Dg Chest 2 View  10/10/2011  *RADIOLOGY REPORT*  Clinical Data: 78 year old female with weakness and shortness of breath.  CHEST - 2 VIEW  Comparison: 08/03/2011 and earlier.  Findings: Chronic cardiomegaly.  Stable mediastinal contours. Stable right chest single lead cardiac pacemaker.  No pneumothorax or pulmonary edema.  There are small pleural effusions.  No consolidation.  Osteopenia. Visualized tracheal air column is within normal limits.  IMPRESSION: Small pleural effusions, otherwise stable chronic cardiomegaly and no other acute cardiopulmonary abnormality.   Original Report Authenticated By: Harley Hallmark, M.D.    Ct Angio Chest Pe W/cm &/or Wo Cm  10/10/2011  *RADIOLOGY REPORT*  Clinical Data: Shortness of breath question pulmonary embolism, left lower quadrant pain, left shoulder and facial pain; history hypertension  CT ANGIOGRAPHY CHEST  Technique:  Multidetector CT imaging of the chest using the standard protocol during bolus administration of intravenous contrast. Multiplanar reconstructed images including MIPs were obtained and reviewed to evaluate the vascular anatomy.  Contrast: OMNIPAQUE IOHEXOL 350 MG/ML SOLN  Comparison: None  Findings: Atherosclerotic calcification aorta with aneurysmal dilatation of ascending thoracic aorta 4.3 x 4.5 cm image 42. Enlarged central pulmonary arteries. Pulmonary vascular cephalization. Scattered respiratory motion artifacts present at the lower lobes, creating artifacts at pulmonary arteries especially in the left lower lobe. Bibasilar atelectasis. Tortuous descending thoracic aorta with aneurysmal  dilatation, aorta measuring 3.6 x 3.5 cm diameter at the aortic hiatus. No definite pulmonary emboli identified. Small bilateral pleural effusions.  Reflux of contrast air into the IVC and hepatic veins. Small cysts left lobe liver. Emphysematous and bronchitic changes compatible with COPD. Pacemaker pack right chest with leads in right atrium and right ventricle. Heart is enlarged with significant enlargement of the atria. Remaining lungs clear. Bones severely demineralized. Cervicothoracic kyphosis and question thoracic ankylosis.  IMPRESSION: No evidence of pulmonary embolism. Atherosclerotic changes with aneurysmal dilatation of the ascending and descending thoracic aorta. Enlarged central pulmonary arteries question pulmonary arterial hypertension. Enlargement of cardiac chambers particularly the atria. Changes of COPD with bibasilar atelectasis and small bilateral pleural effusions.   Original Report Authenticated By: Lollie Marrow, M.D.     Scheduled Meds:   . sodium chloride   Intravenous Once  . antiseptic oral rinse  15 mL Mouth Rinse BID  . feeding supplement  1 Container Oral BID BM  . feeding supplement  1 Container Oral BID BM  . furosemide  20 mg Intravenous Q12H  . furosemide  40 mg Intravenous Once  . metoprolol tartrate  12.5 mg Oral BID  . potassium chloride  40 mEq Oral Once  . sodium chloride  3  mL Intravenous Q12H  . DISCONTD: furosemide  40 mg Intravenous Daily   Continuous Infusions:   Principal Problem:  *Acute diastolic CHF (congestive heart failure) Active Problems:  HYPERTENSION  Dementia  DVT of upper extremity (deep vein thrombosis)  Normocytic anemia  Decreased mobility  Underweight (BMI 17.8)  Atrial fibrillation  Shortness of breath  Hypokalemia    Time spent: > 30 mins    [facility]  [facility] Pager [phone]. If 8PM-8AM, please contact night-coverage at [email or web address], password [email or web address] 10/11/2011, 8:53 AM  LOS: 1 day

## 2011-10-11 NOTE — Progress Notes (Signed)
INITIAL ADULT NUTRITION ASSESSMENT Date: 10/11/2011   Time: 11:35 AM Reason for Assessment: Nutrition Risk   ASSESSMENT: Female 78 y.o.  Dx: Acute diastolic CHF (congestive heart failure)  INTERVENTION: 1. Will order patient Prostat BID, provides 200 kcal and 30 grams of protein daily.  2. Will order patient Ensure BID, provides 500 kcal and 18 grams of protein daily.  3. Will order patient Ensure pudding BID, provides 340 kcal and 8 grams of protein daily. 4. RD to follow for nutrition plan of care.   Hx:  Past Medical History  Diagnosis Date  . Hyperlipidemia   . Chronic ulcer of right leg   . HTN (hypertension)   . Symptomatic bradycardia     a. 08/08/2003 s/p MDT [id number] Sigma 300 PPM (VVI), Ser # [id number] H.  . Kyphosis     severe  . Atrial fibrillation   . Dementia   . History of DVT (deep vein thrombosis)     a. 08/2011 acute Right brachial, axillary, and subclavian DVTs.  . Normocytic anemia   . Physical deconditioning     a. wheelchair bound    Related Meds:  Scheduled Meds:   . sodium chloride   Intravenous Once  . antiseptic oral rinse  15 mL Mouth Rinse BID  . enoxaparin (LOVENOX) injection  40 mg Subcutaneous Q24H  . feeding supplement  1 Container Oral BID BM  . feeding supplement  1 Container Oral BID BM  . furosemide  20 mg Intravenous Q12H  . furosemide  40 mg Intravenous Once  . metoprolol tartrate  12.5 mg Oral BID  . potassium chloride  40 mEq Oral Once  . sodium chloride  3 mL Intravenous Q12H  . warfarin  1.5 mg Oral ONCE-1800  . Warfarin - Pharmacist Dosing Inpatient   Does not apply q1800  . DISCONTD: furosemide  40 mg Intravenous Daily   Continuous Infusions:  PRN Meds:.sodium chloride, acetaminophen, albuterol, iohexol, ipratropium, ondansetron (ZOFRAN) IV, sodium chloride   Ht: 5\' 4"  (162.6 cm)  Wt: 104 lb 11.2 oz (47.492 kg)  Ideal Wt: 54.7 kg  % Ideal Wt: 86.6% Wt Readings from Last 10 Encounters:  10/11/11 104 lb 11.2 oz (47.492  kg)  08/12/11 107 lb (48.535 kg)  08/03/11 107 lb 9.4 oz (48.8 kg)  *Weight down 3 lb since last admission on 7/8.   BMI: 17.85 kg/m^2 (Underweight)  Food/Nutrition Related Hx: Patient reported her appetite is getting a little better. PO intake remains poor, 25% at meals per patient. Patient's family reported patient has been eating a few spoonfuls of Ensure pudding with her medications. Noted, patient with lower left leg wound. Patient agreed to try chocolate Ensure.   Labs:  CMP     Component Value Date/Time   NA 142 10/11/2011 0315   K 3.6 10/11/2011 0315   CL 104 10/11/2011 0315   CO2 27 10/11/2011 0315   GLUCOSE 92 10/11/2011 0315   BUN 18 10/11/2011 0315   CREATININE 0.69 10/11/2011 0315   CALCIUM 9.7 10/11/2011 0315   PROT 7.4 10/11/2011 0315   ALBUMIN 3.3* 10/11/2011 0315   AST 19 10/11/2011 0315   ALT 7 10/11/2011 0315   ALKPHOS 54 10/11/2011 0315   BILITOT 0.6 10/11/2011 0315   GFRNONAA 74* 10/11/2011 0315   GFRAA 86* 10/11/2011 0315    Intake/Output Summary (Last 24 hours) at 10/11/11 1136 Last data filed at 10/11/11 9147  Gross per 24 hour  Intake     30 ml  Output  1500 ml  Net  -1470 ml     Diet Order: Cardiac  Supplements/Tube Feeding: none at this time   IVF:    Estimated Nutritional Needs:   Kcal: 1450-1700 Protein: 75-90 grams  Fluid: 1 ml per kcal intake   NUTRITION DIAGNOSIS: -Inadequate oral intake (NI-2.1).  Status: Ongoing  -Increased nutrient needs r/t underweight status and wound healing a/e/b BMI of 17.85 kg/m^2 and pt with lower left leg wound.   RELATED TO: poor appetite   AS EVIDENCE BY: pt PO intake 25% at meals   MONITORING/EVALUATION(Goals): PO intake, weights, labs, skin 1. PO intake > 75% at meals and supplements.  2. Prevent further weight loss/ promote weight gain.   EDUCATION NEEDS: -No education needs identified at this time  INTERVENTION: 1. Will order patient Prostat BID, provides 200 kcal and 30 grams of protein daily.  2. Will order  patient Ensure BID, provides 500 kcal and 18 grams of protein daily.  3. Will order patient Ensure pudding BID, provides 340 kcal and 8 grams of protein daily. 4. RD to follow for nutrition plan of care.   Dietitian [phone]  DOCUMENTATION CODES Per approved criteria  -Underweight -Moderate malnutrition in the context of acute illness or injury.   *Patient meets malnutrition criteria due to apparent loss of both muscle mass and body fat.   Iven Finn [facility] 10/11/2011, 11:35 AM

## 2011-10-11 NOTE — Evaluation (Signed)
Physical Therapy Evaluation Patient Details Name: [place] MRN: [id number] DOB: 07/29/1918 Today's Date: 10/11/2011 Time: 4098-1191 PT Time Calculation (min): 15 min  PT Assessment / Plan / Recommendation Clinical Impression  Pt admitted w/ SOB/ CHF. Pt is at home and is total care per family. Pt.  has all DME needs per daughter. Pt. has HHPT on board. No further PT needs on acute at this time. PT will sign off.    PT Assessment  Patent does not need any further PT services    Follow Up Recommendations  Home health PT (as PTA)    Barriers to Discharge        Equipment Recommendations  None recommended by PT    Recommendations for Other Services     Frequency      Precautions / Restrictions Precautions Precautions: Fall   Pertinent Vitals/Pain       Mobility  Bed Mobility Bed Mobility: Supine to Sit Supine to Sit: 3: Mod assist Transfers Transfers: Sit to Stand;Stand to Sit;Stand Pivot Transfers Sit to Stand: 1: +2 Total assist;From elevated surface Sit to Stand: Patient Percentage: 20% Stand to Sit: To chair/3-in-1;1: +2 Total assist Stand to Sit: Patient Percentage: 20% Stand Pivot Transfers: 1: +2 Total assist Stand Pivot Transfers: Patient Percentage: 30% Details for Transfer Assistance: pt lifted w/ 2 persons. pt not really able to,use RW. Per daughter , pt pivots w/totall asssitance.    Exercises     PT Diagnosis:    PT Problem List:   PT Treatment Interventions:     PT Goals    Visit Information  Last PT Received On: 10/11/11 Reason Eval/Treat Not Completed: Patient not medically ready    Subjective Data  Subjective: oh yes i can get up   Prior Functioning  Home Living Lives With: Family Available Help at Discharge: Family;Home health Type of Home: House Home Adaptive Equipment: Bedside commode/3-in-1 (hospital bed.) Additional Comments: daughter /careegiver states ahe and her brother get pt into chair, pt is incontinent Prior  Function Level of Independence: Needs assistance Needs Assistance: Bathing;Dressing;Toileting Bath: Total Dressing: Total Toileting: Other (comment) (incontinent.) Communication Communication: No difficulties    Cognition  Overall Cognitive Status: History of cognitive impairments - at baseline    Extremity/Trunk Assessment Right Upper Extremity Assessment RUE ROM/Strength/Tone: Deficits RUE ROM/Strength/Tone Deficits: limited shoulder elevation Left Upper Extremity Assessment LUE ROM/Strength/Tone: Deficits LUE ROM/Strength/Tone Deficits: elevated shoulder elevation Right Lower Extremity Assessment RLE ROM/Strength/Tone: Deficits RLE ROM/Strength/Tone Deficits: decreased strength to  stand Left Lower Extremity Assessment LLE ROM/Strength/Tone: Deficits LLE ROM/Strength/Tone Deficits: decreased strength to stand   Balance    End of Session PT - End of Session Equipment Utilized During Treatment: Gait belt Activity Tolerance: Patient tolerated treatment well Patient left: in chair;with call bell/phone within reach;with family/visitor present Nurse Communication: Mobility status  GP     Rada Hay 10/11/2011, 2:16 PM  [phone]

## 2011-10-11 NOTE — Progress Notes (Signed)
ANTICOAGULATION CONSULT NOTE - Initial Consult  Pharmacy Consult for Warfarin Indication: Hx upper extremity DVT/Hx A.Fib  No Known Allergies  Patient Measurements: Height: 5\' 4"  (162.6 cm) Weight: 104 lb 11.2 oz (47.492 kg) IBW/kg (Calculated) : 54.7    Vital Signs: Temp: 96.9 F (36.1 C) (09/03 0740) Temp src: Axillary (09/03 0740) BP: 138/81 mmHg (09/03 0740) Pulse Rate: 70  (09/03 0740)  Labs:  Basename 10/11/11 0825 10/11/11 0315 10/10/11 2020  HGB -- 12.1 11.9*  HCT -- 37.4 36.5  PLT -- 207 244  APTT -- 34 --  LABPROT -- 20.2* 20.1*  INR -- 1.69* 1.68*  HEPARINUNFRC -- -- --  CREATININE -- 0.69 0.64  CKTOTAL -- -- --  CKMB -- -- --  TROPONINI <0.30 <0.30 --    Estimated Creatinine Clearance: 34.3 ml/min (by C-G formula based on Cr of 0.69).   Medical History: Past Medical History  Diagnosis Date  . High cholesterol   . Heart block   . Ulcer     chronic right leg status ulcer.   Marland Kitchen HTN (hypertension)   . Symptomatic bradycardia 10/11/2011    S/p pacemaker  . Kyphosis 10/11/2011    severe    Medications:  Scheduled:    . sodium chloride   Intravenous Once  . antiseptic oral rinse  15 mL Mouth Rinse BID  . enoxaparin (LOVENOX) injection  40 mg Subcutaneous Q24H  . feeding supplement  1 Container Oral BID BM  . feeding supplement  1 Container Oral BID BM  . furosemide  20 mg Intravenous Q12H  . furosemide  40 mg Intravenous Once  . metoprolol tartrate  12.5 mg Oral BID  . potassium chloride  40 mEq Oral Once  . sodium chloride  3 mL Intravenous Q12H  . DISCONTD: furosemide  40 mg Intravenous Daily   Infusions:   PRN: sodium chloride, acetaminophen, albuterol, iohexol, ipratropium, ondansetron (ZOFRAN) IV, sodium chloride  Assessment:  78 yo F on chronic coumadin for hx of upper extremity DVT/A.Fib.  Admitted with SOB, CT angio chest negative for PE. Cardiac enzymes pending  INR on admission subtherapeutic (1.68).    Coumadin dose PTA 1 mg  daily, last dose reported taken on 9/2  CBC wnl.  No bleeding reported  Patient also on loveox 40 mg Sq Q24h while INR is < 2.  Will monitor renal function.    Goal of Therapy:  INR 2-3 Monitor platelets by anticoagulation protocol: Yes   Plan:  1.) Increase coumadin dose tonight to 1.5 x home dose (= 1.5 mg) at 1800 2.) Follow daily PT/INR 3.) Monitor CBC 4.) F/U d/c lovenx with INR > 2  [name], Loma Messing PharmD Pager #: [phone] 9:42 AM 10/11/2011

## 2011-10-12 ENCOUNTER — Telehealth: Payer: Self-pay | Admitting: Family

## 2011-10-12 DIAGNOSIS — D649 Anemia, unspecified: Secondary | ICD-10-CM

## 2011-10-12 DIAGNOSIS — E440 Moderate protein-calorie malnutrition: Secondary | ICD-10-CM

## 2011-10-12 DIAGNOSIS — I10 Essential (primary) hypertension: Secondary | ICD-10-CM

## 2011-10-12 DIAGNOSIS — I5031 Acute diastolic (congestive) heart failure: Secondary | ICD-10-CM

## 2011-10-12 DIAGNOSIS — R0602 Shortness of breath: Secondary | ICD-10-CM

## 2011-10-12 LAB — CBC
HCT: 35.6 % — ABNORMAL LOW (ref 36.0–46.0)
Hemoglobin: 11.5 g/dL — ABNORMAL LOW (ref 12.0–15.0)
MCH: 27.3 pg (ref 26.0–34.0)
MCHC: 32.3 g/dL (ref 30.0–36.0)

## 2011-10-12 LAB — BASIC METABOLIC PANEL
Calcium: 9.7 mg/dL (ref 8.4–10.5)
GFR calc non Af Amer: 56 mL/min — ABNORMAL LOW (ref >90)
Glucose, Bld: 85 mg/dL (ref 70–99)
Sodium: 145 mEq/L (ref 135–145)

## 2011-10-12 LAB — PROTIME-INR: INR: 1.9 — ABNORMAL HIGH (ref 0.00–1.49)

## 2011-10-12 MED ORDER — WARFARIN SODIUM 1 MG PO TABS
1.0000 mg | ORAL_TABLET | Freq: Once | ORAL | Status: AC
  Administered 2011-10-12: 1 mg via ORAL
  Filled 2011-10-12: qty 1

## 2011-10-12 MED ORDER — POTASSIUM CHLORIDE CRYS ER 20 MEQ PO TBCR
40.0000 meq | EXTENDED_RELEASE_TABLET | Freq: Once | ORAL | Status: AC
  Administered 2011-10-12: 40 meq via ORAL
  Filled 2011-10-12: qty 2

## 2011-10-12 MED ORDER — LISINOPRIL 5 MG PO TABS
5.0000 mg | ORAL_TABLET | Freq: Every day | ORAL | Status: DC
  Administered 2011-10-12 – 2011-10-13 (×2): 5 mg via ORAL
  Filled 2011-10-12 (×2): qty 1

## 2011-10-12 NOTE — Care Management Note (Signed)
    Page 1 of 2   10/12/2011     11:52:57 AM   CARE MANAGEMENT NOTE 10/12/2011  Patient:  [name],[name]   Account Number:  [id number]  Date Initiated:  10/12/2011  Documentation initiated by:  Lorenda Ishihara  Subjective/Objective Assessment:   78 yo female admitted with CHF. PTA lived at home with dtrs.     Action/Plan:   Assist with HH services.   Anticipated DC Date:  10/12/2011   Anticipated DC Plan:  HOME W HOME HEALTH SERVICES      DC Planning Services  CM consult      [facility] Choice  HOME HEALTH  Resumption Of Svcs/PTA Provider   Choice offered to / List presented to:  C-1 Patient        HH arranged  HH-1 RN  HH-2 PT  HH-10 DISEASE MANAGEMENT      HH agency  [facility]   Status of service:  Completed, signed off Medicare Important Message given?   (If response is "NO", the following Medicare IM given date fields will be blank) Date Medicare IM given:   Date Additional Medicare IM given:    Discharge Disposition:  HOME W HOME HEALTH SERVICES  Per UR Regulation:  Reviewed for med. necessity/level of care/duration of stay  If discussed at Long Length of Stay Meetings, dates discussed:    Comments:  10-12-11 Lorenda Ishihara RN CM 1150 Spoke with dtr and patient at bedside. Hopeful for d/c home today. Patient is active with [facility] for PT, will add RN for CHF protocol. Patient and dtr agreeable. Contacted [name] with [facility] to arrange. Awaiting final orders.

## 2011-10-12 NOTE — Progress Notes (Signed)
[facility] PROGRESS NOTE  [name]:[id number] DOB: 06/25/1918 DOA: 10/10/2011 PCP: Rogelia Boga, MD  Assessment/Plan: Principal Problem:  *Acute diastolic CHF (congestive heart failure) Active Problems:  HYPERTENSION  Dementia  DVT of upper extremity (deep vein thrombosis)  Normocytic anemia  Decreased mobility  Underweight (BMI 17.8)  Atrial fibrillation  Shortness of breath  Hypokalemia  #1 acute diastolic CHF exacerbation Cardiac enzymes are pending. 2-D echo is results reviewed. BNP elevated on admission at 9771. Continue strict I.'s and O.'s and daily weights.  - Cardiology on board and will follow up with their recommendations - With continued improvement will consider discharge soon will discuss with cardiology next am. - Patient has had net output of 2,600 ml  #2 shortness of breath Likely secondary to problem #1. CT angiogram of the chest is negative for PE. Also negative for infiltrate. Patient does not have any wheezing and no signs of COPD exacerbation. See problem #1. - Resolved with IV lasix administration.  #3 history of upper extremity DVT INR is subtherapeutic. On Coumadin per pharmacy.  #4 hypertension Patient has been started on low-dose beta blocker. Relatively well controlled with last bp 107/53  #5 normocytic anemia Follow H&H No active bleeding and stable currently  #6 dementia Stable.  #7 moderate malnutrition/underweight Continue resource breeze.  #8 hypokalemia Will plan on repleting orally today with k dur 40 meq x 1.  Likely 2ary to lasix.  #9 prophylaxis Lovenox for DVT prophylaxis. When INR becomes therapeutic we'll discontinue Lovenox.   Code Status: Full Family Communication: The patient and daughter at bedside Disposition Plan: Home when medically stable.   Brief narrative: 78 year old female with past medical history of dementia, DVT (righ upper extremity), atrial fibrillation (on coumadin),  wheelchair bound who presented with progressively worsening shortness of breath started 1 week prior to this admission with no associated cough or fever or chills. Patient has no complaints of chest pain, no abdominal pain, no nausea or vomiting. No lightheadedness or loss of consciousness.      Consultants Cardiology: Corinda Gubler pending 10/11/2011  Procedures:  Chest x-ray 10/10/2011  CT angiogram of the chest 10/10/2011  Antibiotics:  none  HPI/Subjective: Patient mentions that she feels much better today.  No acute issues reported ovenright. No new complaints.  Objective: Filed Vitals:   10/11/11 1404 10/11/11 2149 10/12/11 0500 10/12/11 1521  BP: 128/74 151/84  107/53  Pulse: 69 69  72  Temp: 98.4 F (36.9 C) 98.2 F (36.8 C)  98 F (36.7 C)  TempSrc: Oral Oral  Oral  Resp: 16 16  16   Height:      Weight:   41.459 kg (91 lb 6.4 oz)   SpO2: 99% 99%  97%    Intake/Output Summary (Last 24 hours) at 10/12/11 1837 Last data filed at 10/12/11 0820  Gross per 24 hour  Intake    302 ml  Output    750 ml  Net   -448 ml   Filed Weights   10/10/11 2345 10/11/11 0243 10/12/11 0500  Weight: 49.896 kg (110 lb) 47.492 kg (104 lb 11.2 oz) 41.459 kg (91 lb 6.4 oz)    Exam: General: Alert, awake, oriented x3, in no acute distress. Severe kyphosis HEENT: No bruits, no goiter. Heart: Regular rate and rhythm, without murmurs, rubs, gallops. Lungs: Decreased breath sounds in the bases. No wheezing. Abdomen: Soft, nontender, nondistended, positive bowel sounds. Extremities: No clubbing cyanosis. Trace BLE edema with positive pedal pulses. Neuro: Grossly intact, nonfocal.  Data Reviewed: Basic Metabolic Panel:  Lab 10/12/11 4098 10/11/11 0315 10/10/11 2020  NA 145 142 144  K 3.1* 3.6 3.2*  CL 103 104 107  CO2 34* 27 24  GLUCOSE 85 92 111*  BUN 19 18 19   CREATININE 0.88 0.69 0.64  CALCIUM 9.7 9.7 9.6  MG -- 1.7 --  PHOS -- -- --   Liver Function Tests:  Lab 10/11/11  0315  AST 19  ALT 7  ALKPHOS 54  BILITOT 0.6  PROT 7.4  ALBUMIN 3.3*   No results found for this basename: LIPASE:5,AMYLASE:5 in the last 168 hours No results found for this basename: AMMONIA:5 in the last 168 hours CBC:  Lab 10/12/11 0432 10/11/11 0315 10/10/11 2020  WBC 7.8 6.8 7.4  NEUTROABS -- 4.2 --  HGB 11.5* 12.1 11.9*  HCT 35.6* 37.4 36.5  MCV 84.4 83.7 84.7  PLT 205 207 244   Cardiac Enzymes:  Lab 10/11/11 0825 10/11/11 0315  CKTOTAL -- --  CKMB -- --  CKMBINDEX -- --  TROPONINI <0.30 <0.30   BNP (last 3 results)  Basename 10/12/11 0432 10/11/11 0825 10/10/11 2020  PROBNP 19881.0* 15820.0* 9771.0*   CBG: No results found for this basename: GLUCAP:5 in the last 168 hours  No results found for this or any previous visit (from the past 240 hour(s)).   Studies: Dg Chest 2 View  10/10/2011  *RADIOLOGY REPORT*  Clinical Data: 78 year old female with weakness and shortness of breath.  CHEST - 2 VIEW  Comparison: 08/03/2011 and earlier.  Findings: Chronic cardiomegaly.  Stable mediastinal contours. Stable right chest single lead cardiac pacemaker.  No pneumothorax or pulmonary edema.  There are small pleural effusions.  No consolidation.  Osteopenia. Visualized tracheal air column is within normal limits.  IMPRESSION: Small pleural effusions, otherwise stable chronic cardiomegaly and no other acute cardiopulmonary abnormality.   Original Report Authenticated By: Harley Hallmark, M.D.    Ct Angio Chest Pe W/cm &/or Wo Cm  10/10/2011  *RADIOLOGY REPORT*  Clinical Data: Shortness of breath question pulmonary embolism, left lower quadrant pain, left shoulder and facial pain; history hypertension  CT ANGIOGRAPHY CHEST  Technique:  Multidetector CT imaging of the chest using the standard protocol during bolus administration of intravenous contrast. Multiplanar reconstructed images including MIPs were obtained and reviewed to evaluate the vascular anatomy.  Contrast: OMNIPAQUE  IOHEXOL 350 MG/ML SOLN  Comparison: None  Findings: Atherosclerotic calcification aorta with aneurysmal dilatation of ascending thoracic aorta 4.3 x 4.5 cm image 42. Enlarged central pulmonary arteries. Pulmonary vascular cephalization. Scattered respiratory motion artifacts present at the lower lobes, creating artifacts at pulmonary arteries especially in the left lower lobe. Bibasilar atelectasis. Tortuous descending thoracic aorta with aneurysmal dilatation, aorta measuring 3.6 x 3.5 cm diameter at the aortic hiatus. No definite pulmonary emboli identified. Small bilateral pleural effusions.  Reflux of contrast air into the IVC and hepatic veins. Small cysts left lobe liver. Emphysematous and bronchitic changes compatible with COPD. Pacemaker pack right chest with leads in right atrium and right ventricle. Heart is enlarged with significant enlargement of the atria. Remaining lungs clear. Bones severely demineralized. Cervicothoracic kyphosis and question thoracic ankylosis.  IMPRESSION: No evidence of pulmonary embolism. Atherosclerotic changes with aneurysmal dilatation of the ascending and descending thoracic aorta. Enlarged central pulmonary arteries question pulmonary arterial hypertension. Enlargement of cardiac chambers particularly the atria. Changes of COPD with bibasilar atelectasis and small bilateral pleural effusions.   Original Report Authenticated By: Lollie Marrow, M.D.  Scheduled Meds:    . antiseptic oral rinse  15 mL Mouth Rinse BID  . feeding supplement  237 mL Oral BID BM  . feeding supplement  1 Container Oral BID BM  . feeding supplement  30 mL Oral BID WC  . furosemide  20 mg Intravenous Q12H  . hydrocerin   Topical Daily  . lisinopril  5 mg Oral Daily  . metoprolol tartrate  12.5 mg Oral BID  . sodium chloride  3 mL Intravenous Q12H  . warfarin  1 mg Oral ONCE-1800  . Warfarin - Pharmacist Dosing Inpatient   Does not apply q1800  . DISCONTD: enoxaparin (LOVENOX)  injection  40 mg Subcutaneous Q24H   Continuous Infusions:   Principal Problem:  *Acute diastolic CHF (congestive heart failure) Active Problems:  HYPERTENSION  Dementia  DVT of upper extremity (deep vein thrombosis)  Normocytic anemia  Decreased mobility  Underweight (BMI 17.8)  Atrial fibrillation  Shortness of breath  Hypokalemia    Time spent: > 30 mins    Penny Pia  [facility] Pager [phone]. If 8PM-8AM, please contact night-coverage at [email or web address], password [facility] 10/12/2011, 6:37 PM  LOS: 2 days

## 2011-10-12 NOTE — Progress Notes (Signed)
PROGRESS NOTE  Subjective:   [name] is a 78 yo admitted with dyspnea.  echo yesterday showed   Left ventricle: The cavity size was normal. Wall thickness was increased in a pattern of mild LVH. There was moderate focal basal hypertrophy. Systolic function was moderately reduced. The estimated ejection fraction was in the range of 35% to 40%. Indeterminant diastolic function. Septal hypokinesis. Apical, anterior apical, and inferior apical akinesis. - Aortic valve: Mild to moderate regurgitation. - Ascending aorta: The ascending aorta was mildly dilated to 4.4 cm. - Mitral valve: Moderately calcified annulus. Mild to moderate regurgitation. - Left atrium: The atrium was mildly to moderately dilated. - Right ventricle: The cavity size was normal. Pacer wire or catheter noted in right ventricle. Systolic function was mildly reduced. - Right atrium: The atrium was mildly to moderately dilated. - Tricuspid valve: Mild-moderate regurgitation. Peak RV-RA gradient:82mm Hg (S). - Pulmonic valve: Moderate regurgitation. - Pulmonary arteries: PA peak pressure: 62mm Hg (S).    Objective:    Vital Signs:   Temp:  [96.9 F (36.1 C)-98.4 F (36.9 C)] 98.2 F (36.8 C) (09/03 2149) Pulse Rate:  [69-70] 69  (09/03 2149) Resp:  [16] 16  (09/03 2149) BP: (128-151)/(74-84) 151/84 mmHg (09/03 2149) SpO2:  [99 %] 99 % (09/03 2149) Weight:  [91 lb 6.4 oz (41.459 kg)] 91 lb 6.4 oz (41.459 kg) (09/04 0500)  Last BM Date: 10/11/11   24-hour weight change: Weight change: -18 lb 9.6 oz (-8.437 kg)  Weight trends: Filed Weights   10/10/11 2345 10/11/11 0243 10/12/11 0500  Weight: 110 lb (49.896 kg) 104 lb 11.2 oz (47.492 kg) 91 lb 6.4 oz (41.459 kg)    Intake/Output:  09/03 0701 - 09/04 0700 In: 332 [P.O.:330; IV Piggyback:2] Out: 1625 [Urine:1625]     Physical Exam: BP 151/84  Pulse 69  Temp 98.2 F (36.8 C) (Oral)  Resp 16  Ht 5\' 4"  (1.626 m)  Wt 91 lb 6.4 oz (41.459  kg)  BMI 15.69 kg/m2  SpO2 99%  General: Vital signs reviewed and noted.  Elderly, thin,   Head: Normocephalic, atraumatic.  Eyes: conjunctivae/corneas clear.  EOM's intact.   Throat: normal  Neck: Supple. Normal carotids. No JVD  Lungs:  Rales in left base  Heart: Regular rate,  With normal  S1 S2. No murmurs, gallops or rubs  Abdomen:  Soft, non-tender, non-distended with normoactive bowel sounds. No hepatomegaly. No rebound/guarding. No abdominal masses.  Extremities: Distal pedal pulses are 2+ .  No edema.    Neurologic: A&O X3, CN II - XII are grossly intact. Motor strength is 5/5 in the all 4 extremities.  Psych: Responds to questions appropriately with normal affect.    Labs: BMET:  Basename 10/12/11 0432 10/11/11 0315  NA 145 142  K 3.1* 3.6  CL 103 104  CO2 34* 27  GLUCOSE 85 92  BUN 19 18  CREATININE 0.88 0.69  CALCIUM 9.7 9.7  MG -- 1.7  PHOS -- --    Liver function tests:  Basename 10/11/11 0315  AST 19  ALT 7  ALKPHOS 54  BILITOT 0.6  PROT 7.4  ALBUMIN 3.3*   No results found for this basename: LIPASE:2,AMYLASE:2 in the last 72 hours  CBC:  Basename 10/12/11 0432 10/11/11 0315  WBC 7.8 6.8  NEUTROABS -- 4.2  HGB 11.5* 12.1  HCT 35.6* 37.4  MCV 84.4 83.7  PLT 205 207    Cardiac Enzymes:  Basename 10/11/11 0825 10/11/11 0315  CKTOTAL -- --  CKMB -- --  TROPONINI <0.30 <0.30    Coagulation Studies:  Basename 10/12/11 0432 10-12-2011 0315 10/10/11 2020  LABPROT 22.1* 20.2* 20.1*  INR 1.90* 1.69* 1.68*    Other: No components found with this basename: POCBNP:3  Basename 10/10/11 2020  DDIMER 1.54*   No results found for this basename: HGBA1C in the last 72 hours No results found for this basename: CHOL,HDL,LDLCALC,TRIG,CHOLHDL in the last 72 hours  Basename 2011/10/12 0315  TSH 1.017  T4TOTAL --  T3FREE --  THYROIDAB --   No results found for this basename: VITAMINB12,FOLATE,FERRITIN,TIBC,IRON,RETICCTPCT in the last 72  hours    Tele: V paced at 70  Medications:    Infusions:    Scheduled Medications:    . antiseptic oral rinse  15 mL Mouth Rinse BID  . enoxaparin (LOVENOX) injection  40 mg Subcutaneous Q24H  . feeding supplement  237 mL Oral BID BM  . feeding supplement  1 Container Oral BID BM  . feeding supplement  30 mL Oral BID WC  . furosemide  20 mg Intravenous Q12H  . hydrocerin   Topical Daily  . metoprolol tartrate  12.5 mg Oral BID  . sodium chloride  3 mL Intravenous Q12H  . warfarin  1.5 mg Oral ONCE-1800  . Warfarin - Pharmacist Dosing Inpatient   Does not apply q1800  . DISCONTD: feeding supplement  1 Container Oral BID BM  . DISCONTD: feeding supplement  2 Container Oral BID BM  . DISCONTD: feeding supplement  1 Container Oral BID BM  . DISCONTD: furosemide  40 mg Intravenous Daily    Assessment/ Plan:     Acute diastolic CHF (congestive heart failure) (10/10/2011) Her echo shows moderately reduced LV function, mild - moderate AI, MR, TR, and PI.  She has moderate - severe pulmonary hypertension.  She has diuresed overnight - however her pro - BNP is higher Her renal function is OK.  She may benefit from addition of ACE inhibitor  Continue IV lasix and other medications.  HYPERTENSION (11/01/2006) Will add Lisinopril 5 QD.  Atrial fibrillation (08/22/2011) She is currently V paced.  Continue coumadin    Disposition:  Length of Stay: 2  Alvia Grove., MD, [facility] 10/12/2011, 7:31 AM Office [phone] Pager [phone]

## 2011-10-12 NOTE — Progress Notes (Signed)
ANTICOAGULATION CONSULT NOTE - Initial Consult  Pharmacy Consult for Coumadin Indication: h/o afib and DVT  No Known Allergies  Patient Measurements: Height: 5\' 4"  (162.6 cm) Weight: 91 lb 6.4 oz (41.459 kg) IBW/kg (Calculated) : 54.7   Vital Signs:    Labs:  Basename 10/12/11 0432 10/11/11 0825 10/11/11 0315 10/10/11 2020  HGB 11.5* -- 12.1 --  HCT 35.6* -- 37.4 36.5  PLT 205 -- 207 244  APTT -- -- 34 --  LABPROT 22.1* -- 20.2* 20.1*  INR 1.90* -- 1.69* 1.68*  HEPARINUNFRC -- -- -- --  CREATININE 0.88 -- 0.69 0.64  CKTOTAL -- -- -- --  CKMB -- -- -- --  TROPONINI -- <0.30 <0.30 --   Estimated Creatinine Clearance: 27.3 ml/min (by C-G formula based on Cr of 0.88).  Medical History: Past Medical History  Diagnosis Date  . Hyperlipidemia   . Chronic ulcer of right leg   . HTN (hypertension)   . Symptomatic bradycardia     a. 08/08/2003 s/p MDT [id number] Sigma 300 PPM (VVI), Ser # [id number] H.  . Kyphosis     severe  . Atrial fibrillation   . Dementia   . History of DVT (deep vein thrombosis)     a. 08/2011 acute Right brachial, axillary, and subclavian DVTs.  . Normocytic anemia   . Physical deconditioning     a. wheelchair bound   Assessment:  89 yof on Coumadin PTA for h/o atrial fibrillation and DVT.  Patient was reportedly on Coumadin 1mg /day and INR was subtherapeutic on admit.  Lovenox 40 mg sq q24h hours added.   INR today at 1.9, CBC okay, no bleeding/complications reported.   Per Anti-Coag note 7/15 by Corinda Gubler @ [facility], patient's goal INR is 2.5 to 3.5 and indications for anticoagulation included: DVT, pacemaker, mitral valve disorders, aortic valve disorders, afib, heart valves.  I communicated with patient's daughter - who denied any mechanical valves.  Calio @ [facility] unable to identify why patient's INR goal is higher than usual so they will make the adjustment to a regular INR goal of 2-3 for future visits.  Dr. Cena Benton made aware and agreed with  continuing goal INR of 2-3.   Goal of Therapy:  INR 2-3 Monitor platelets by anticoagulation protocol: Yes   Plan:   Coumadin 1mg  po x 1 tonight  Will discontinue Lovenox given weight, CrCl and now INR nearing therapeutic.  Daily PT/INR, pharmacy will f/u  Geoffry Paradise Thi 10/12/2011,12:14 PM

## 2011-10-12 NOTE — Telephone Encounter (Signed)
Call from [facility] regarding patient's INR goal. She does not have a mechanical heart valve. Only has mitral valve dysfunction and aortic valve. Her INR goal should be 2.0-3.0. I will update her goal at her next office visit.

## 2011-10-13 DIAGNOSIS — I10 Essential (primary) hypertension: Secondary | ICD-10-CM

## 2011-10-13 LAB — PROTIME-INR: INR: 1.94 — ABNORMAL HIGH (ref 0.00–1.49)

## 2011-10-13 LAB — BASIC METABOLIC PANEL
CO2: 33 mEq/L — ABNORMAL HIGH (ref 19–32)
Calcium: 9.6 mg/dL (ref 8.4–10.5)
Creatinine, Ser: 0.9 mg/dL (ref 0.50–1.10)
Glucose, Bld: 80 mg/dL (ref 70–99)

## 2011-10-13 MED ORDER — WARFARIN SODIUM 1 MG PO TABS
1.0000 mg | ORAL_TABLET | Freq: Once | ORAL | Status: DC
  Filled 2011-10-13: qty 1

## 2011-10-13 MED ORDER — ENOXAPARIN SODIUM 30 MG/0.3ML ~~LOC~~ SOLN
30.0000 mg | Freq: Once | SUBCUTANEOUS | Status: AC
  Administered 2011-10-13: 30 mg via SUBCUTANEOUS
  Filled 2011-10-13: qty 0.3

## 2011-10-13 MED ORDER — LISINOPRIL 5 MG PO TABS: 5.0000 mg | ORAL_TABLET | Freq: Every day | ORAL | Status: DC

## 2011-10-13 MED ORDER — PRO-STAT SUGAR FREE PO LIQD: 30.0000 mL | Freq: Two times a day (BID) | ORAL | Status: DC

## 2011-10-13 MED ORDER — METOPROLOL TARTRATE 12.5 MG HALF TABLET: 12.5000 mg | ORAL_TABLET | Freq: Two times a day (BID) | ORAL | Status: DC

## 2011-10-13 NOTE — Discharge Summary (Signed)
Physician Discharge Summary  [facility] VWU:[id number] DOB: [birth year] DOA: 10/10/2011  PCP: Rogelia Boga, MD  Admit date: 10/10/2011 Discharge date: 10/13/2011  Recommendations for Outpatient Follow-up:  1. Patient needs to get INR rechecked. 2. Also evaluate to see whether patient will require diuretics as outpatient  3. Pt will discharged with physical therapy and Nursing at home.  Discharge Diagnoses:  Principal Problem:  *Acute diastolic CHF (congestive heart failure) Active Problems:  HYPERTENSION  Dementia  DVT of upper extremity (deep vein thrombosis)  Normocytic anemia  Decreased mobility  Underweight (BMI 17.8)  Atrial fibrillation  Shortness of breath  Hypokalemia   Discharge Condition: Stable  Diet recommendation: Cardiac with fluid restriction of 1800 ml per day  Filed Weights   10/11/11 0243 10/12/11 0500 10/13/11 0500  Weight: 47.492 kg (104 lb 11.2 oz) 41.459 kg (91 lb 6.4 oz) 41.595 kg (91 lb 11.2 oz)    History of present illness:  From original HPI 78 year old female with past medical history of dementia, DVT (righ upper extremity), atrial fibrillation (on coumadin), wheelchair bound who presented with progressively worsening shortness of breath started 1 week prior to this admission with no associated cough or fever or chills. Patient has no complaints of chest pain, no abdominal pain, no nausea or vomiting. No lightheadedness or loss of consciousness.   Hospital Course:   1 acute diastolic CHF exacerbation  Cardiac enzymes are pending. 2-D echo is results reviewed. BNP elevated on admission at 9771. Continue strict I.'s and O.'s and daily weights.  - Would recommend salt restriction to 2 grams or less per day. - Patient has had net output of 3270 ml while inhouse - Cardiology on board and they recommended the following: Her echo shows moderately reduced LV function, mild - moderate AI, MR, TR, and PI. She has moderate - severe pulmonary  hypertension. She is feeling better. She appears to be stable and may be at her baseline for age of 30.  Would DC IV lasix and resume home meds. She was not on lasix at home. She will need to be assessed to see if she needs any diuretic as a home med.  HYPERTENSION (11/01/2006) Better with addition of Lisinopril. She will need BMP in 1-2 weeks at her medical doctor's office.   Atrial fibrillation (08/22/2011) She is currently V paced. Continue coumadin  #2 shortness of breath  Likely secondary to problem #1. CT angiogram of the chest is negative for PE. Also negative for infiltrate. Patient does not have any wheezing and no signs of COPD exacerbation. See problem #1.  - Resolved with IV lasix administration.   #3 history of upper extremity DVT  INR is subtherapeutic. Last INR near target and was 1.9 (9/5) will continue home regimen and have patient follow up as outpatient with her PCP.  #4 hypertension  Relatively well controlled on B blocker and Ace inhibitor   #5 normocytic anemia  Follow H&H  No active bleeding and stable currently   #6 dementia  Stable.   #7 moderate malnutrition/underweight  Continue ensure and prostat supplements  #8 hypokalemia  Resolved after oral repletion and was likely 2ary to lasix administration.  Will have patient follow up with her PCP to decide whether or not patient will require diuretics at home.  Once discharged would recommend fluid restriction and salt restriction.  #9 prophylaxis  Lovenox for DVT prophylaxis. When INR becomes therapeutic we'll discontinue Lovenox.   Procedures:  Echocardiogram  Consultations:  Kristeen Miss with Cardiology  Discharge Exam: Filed Vitals:   10/13/11 0953  BP: 106/70  Pulse: 70  Temp:   Resp:    Filed Vitals:   10/12/11 2119 10/13/11 0500 10/13/11 0618 10/13/11 0953  BP: 123/79  118/78 106/70  Pulse: 69  69 70  Temp: 97.7 F (36.5 C)  97.5 F (36.4 C)   TempSrc: Oral  Oral   Resp: 18  16     Height:      Weight:  41.595 kg (91 lb 11.2 oz)    SpO2: 96%  95%     General: Pt in NAD, A and O x3 Cardiovascular: RRR, no murmurs or rubs Respiratory: CTA BL, no wheezes or increased work of breathing.  Discharge Instructions  Discharge Orders    Future Orders Please Complete By Expires   Diet - low sodium heart healthy      Increase activity slowly      Discharge instructions      Comments:   Please follow with Dr. Kristeen Miss (Cardiologist) [phone]  Also follow up with your primary care physician in 1-2 weeks. Will resume home health Physical therapy and Nursing.   Call MD for:  redness, tenderness, or signs of infection (pain, swelling, redness, odor or green/yellow discharge around incision site)      (HEART FAILURE PATIENTS) Call MD:  Anytime you have any of the following symptoms: 1) 3 pound weight gain in 24 hours or 5 pounds in 1 week 2) shortness of breath, with or without a dry hacking cough 3) swelling in the hands, feet or stomach 4) if you have to sleep on extra pillows at night in order to breathe.      Call MD for:  extreme fatigue        Medication List  As of 10/13/2011 12:55 PM   TAKE these medications         feeding supplement Liqd   Take 1 Container by mouth 2 (two) times daily between meals.      feeding supplement Pudg   Take 1 Container by mouth 2 (two) times daily between meals.      feeding supplement Liqd   Take 30 mLs by mouth 2 (two) times daily with a meal.      lisinopril 5 MG tablet   Commonly known as: PRINIVIL,ZESTRIL   Take 1 tablet (5 mg total) by mouth daily.      metoprolol tartrate 12.5 mg Tabs   Commonly known as: LOPRESSOR   Take 0.5 tablets (12.5 mg total) by mouth 2 (two) times daily.      warfarin 1 MG tablet   Commonly known as: COUMADIN   Take 1 mg by mouth daily.              The results of significant diagnostics from this hospitalization (including imaging, microbiology, ancillary and laboratory) are  listed below for reference.    Significant Diagnostic Studies: Dg Chest 2 View  10/10/2011  *RADIOLOGY REPORT*  Clinical Data: 78 year old female with weakness and shortness of breath.  CHEST - 2 VIEW  Comparison: 08/03/2011 and earlier.  Findings: Chronic cardiomegaly.  Stable mediastinal contours. Stable right chest single lead cardiac pacemaker.  No pneumothorax or pulmonary edema.  There are small pleural effusions.  No consolidation.  Osteopenia. Visualized tracheal air column is within normal limits.  IMPRESSION: Small pleural effusions, otherwise stable chronic cardiomegaly and no other acute cardiopulmonary abnormality.   Original Report Authenticated By: Harley Hallmark, M.D.    Ct  Angio Chest Pe W/cm &/or Wo Cm  10/10/2011  *RADIOLOGY REPORT*  Clinical Data: Shortness of breath question pulmonary embolism, left lower quadrant pain, left shoulder and facial pain; history hypertension  CT ANGIOGRAPHY CHEST  Technique:  Multidetector CT imaging of the chest using the standard protocol during bolus administration of intravenous contrast. Multiplanar reconstructed images including MIPs were obtained and reviewed to evaluate the vascular anatomy.  Contrast: OMNIPAQUE IOHEXOL 350 MG/ML SOLN  Comparison: None  Findings: Atherosclerotic calcification aorta with aneurysmal dilatation of ascending thoracic aorta 4.3 x 4.5 cm image 42. Enlarged central pulmonary arteries. Pulmonary vascular cephalization. Scattered respiratory motion artifacts present at the lower lobes, creating artifacts at pulmonary arteries especially in the left lower lobe. Bibasilar atelectasis. Tortuous descending thoracic aorta with aneurysmal dilatation, aorta measuring 3.6 x 3.5 cm diameter at the aortic hiatus. No definite pulmonary emboli identified. Small bilateral pleural effusions.  Reflux of contrast air into the IVC and hepatic veins. Small cysts left lobe liver. Emphysematous and bronchitic changes compatible with COPD.  Pacemaker pack right chest with leads in right atrium and right ventricle. Heart is enlarged with significant enlargement of the atria. Remaining lungs clear. Bones severely demineralized. Cervicothoracic kyphosis and question thoracic ankylosis.  IMPRESSION: No evidence of pulmonary embolism. Atherosclerotic changes with aneurysmal dilatation of the ascending and descending thoracic aorta. Enlarged central pulmonary arteries question pulmonary arterial hypertension. Enlargement of cardiac chambers particularly the atria. Changes of COPD with bibasilar atelectasis and small bilateral pleural effusions.   Original Report Authenticated By: Lollie Marrow, M.D.     Microbiology: No results found for this or any previous visit (from the past 240 hour(s)).   Labs: Basic Metabolic Panel:  Lab 10/13/11 1914 10/12/11 0432 10/11/11 0315 10/10/11 2020  NA 142 145 142 144  K 3.8 3.1* 3.6 3.2*  CL 101 103 104 107  CO2 33* 34* 27 24  GLUCOSE 80 85 92 111*  BUN 25* 19 18 19   CREATININE 0.90 0.88 0.69 0.64  CALCIUM 9.6 9.7 9.7 9.6  MG -- -- 1.7 --  PHOS -- -- -- --   Liver Function Tests:  Lab 10/11/11 0315  AST 19  ALT 7  ALKPHOS 54  BILITOT 0.6  PROT 7.4  ALBUMIN 3.3*   No results found for this basename: LIPASE:5,AMYLASE:5 in the last 168 hours No results found for this basename: AMMONIA:5 in the last 168 hours CBC:  Lab 10/12/11 0432 10/11/11 0315 10/10/11 2020  WBC 7.8 6.8 7.4  NEUTROABS -- 4.2 --  HGB 11.5* 12.1 11.9*  HCT 35.6* 37.4 36.5  MCV 84.4 83.7 84.7  PLT 205 207 244   Cardiac Enzymes:  Lab 10/11/11 0825 10/11/11 0315  CKTOTAL -- --  CKMB -- --  CKMBINDEX -- --  TROPONINI <0.30 <0.30   BNP: BNP (last 3 results)  Basename 10/12/11 0432 10/11/11 0825 10/10/11 2020  PROBNP 19881.0* 15820.0* 9771.0*   CBG: No results found for this basename: GLUCAP:5 in the last 168 hours  Time coordinating discharge: > 35  minutes  Signed:  Penny Pia  [facility] 10/13/2011, 12:55 PM

## 2011-10-13 NOTE — Progress Notes (Addendum)
ANTICOAGULATION CONSULT NOTE - Follow Up Consult  Pharmacy Consult for Warfarin Indication: Hx of AFib and DVT  No Known Allergies  Patient Measurements: Height: 5\' 4"  (162.6 cm) Weight: 91 lb 11.2 oz (41.595 kg) IBW/kg (Calculated) : 54.7   Vital Signs: Temp: 97.5 F (36.4 C) (09/05 0618) Temp src: Oral (09/05 0618) BP: 106/70 mmHg (09/05 0953) Pulse Rate: 70  (09/05 0953)  Labs:  Basename 10/13/11 0440 10/12/11 0432 10/11/11 0825 10/11/11 0315 10/10/11 2020  HGB -- 11.5* -- 12.1 --  HCT -- 35.6* -- 37.4 36.5  PLT -- 205 -- 207 244  APTT -- -- -- 34 --  LABPROT 22.5* 22.1* -- 20.2* --  INR 1.94* 1.90* -- 1.69* --  HEPARINUNFRC -- -- -- -- --  CREATININE 0.90 0.88 -- 0.69 --  CKTOTAL -- -- -- -- --  CKMB -- -- -- -- --  TROPONINI -- -- <0.30 <0.30 --    Estimated Creatinine Clearance: 26.7 ml/min (by C-G formula based on Cr of 0.9).   Medications:  Scheduled:    . antiseptic oral rinse  15 mL Mouth Rinse BID  . feeding supplement  237 mL Oral BID BM  . feeding supplement  1 Container Oral BID BM  . feeding supplement  30 mL Oral BID WC  . hydrocerin   Topical Daily  . lisinopril  5 mg Oral Daily  . metoprolol tartrate  12.5 mg Oral BID  . potassium chloride  40 mEq Oral Once  . sodium chloride  3 mL Intravenous Q12H  . warfarin  1 mg Oral ONCE-1800  . Warfarin - Pharmacist Dosing Inpatient   Does not apply q1800  . DISCONTD: enoxaparin (LOVENOX) injection  40 mg Subcutaneous Q24H  . DISCONTD: furosemide  20 mg Intravenous Q12H   Infusions:    Assessment: 89 yof on Coumadin PTA for h/o atrial fibrillation and DVT. Patient was reportedly on Coumadin 1mg /day and INR was subtherapeutic on admit. Lovenox 40 mg sq q24h hours added. Per Anti-Coag note 7/15 by Corinda Gubler @ [facility], patient's goal INR is 2.5 to 3.5 and indications for anticoagulation included: DVT, pacemaker, mitral valve disorders, aortic valve disorders, afib, heart valves. I communicated with  patient's daughter - who denied any mechanical valves. Leadore @ [facility] unable to identify why patient's INR goal is higher than usual so they will make the adjustment to a regular INR goal of 2-3 for future visits. Dr. Cena Benton made aware and agreed with continuing goal INR of 2-3.   Today's INR = 1.94, just below goal 2-3. No bleeding/complications reported in chart notes.  Goal of Therapy:  INR 2-3   Plan:  1) Warfarin 1mg  PO x1 tonight. 2) Daily INR 3) If pt is discharged today, suggest resuming home warfarin dose of 1mg  PO daily.  Darrol Angel, PharmD Pager: [phone] 10/13/2011,10:45 AM   ADDENDUM: Lovenox was d/c'd yesterday under the assumption INR would be therapeutic today. Will order Lovenox 30mg  SQ x1 today and f/u with INR in am (renally adjusted and wt<45kg).  Darrol Angel, PharmD Pager: [phone] 10/13/2011 10:52 AM

## 2011-10-13 NOTE — Progress Notes (Signed)
Heart Failure booklet given. Pt needs reinforcement, very forgetful. Patient resides with family.  Family should be informed of the concerns of heart failure signs and symptoms. Will follow up with dayshift nurse to question family about the heart failure signs and symptoms.

## 2011-10-13 NOTE — Progress Notes (Signed)
CSW contacted by Marye Round that patient will need [facility] transport home. CSW confirmed with patient & daughter at bedside. [facility] called for transport.   Unice Bailey, LCSW [facility] Clinical Social Worker cell #: [phone]

## 2011-10-13 NOTE — Progress Notes (Addendum)
PROGRESS NOTE  Subjective:   [name] is a 78 yo admitted with dyspnea. She has moderate LV dysfunction and moderate - severe pulmonary hypertension.   she has diuresed.  She is up eating breakfast.  Feels better.  Objective:    Vital Signs:   Temp:  [97.5 F (36.4 C)-98 F (36.7 C)] 97.5 F (36.4 C) (09/05 0618) Pulse Rate:  [69-72] 69  (09/05 0618) Resp:  [16-18] 16  (09/05 0618) BP: (107-123)/(53-79) 118/78 mmHg (09/05 0618) SpO2:  [95 %-97 %] 95 % (09/05 0618) Weight:  [91 lb 11.2 oz (41.595 kg)] 91 lb 11.2 oz (41.595 kg) (09/05 0500)  Last BM Date: 10/11/11   24-hour weight change: Weight change: 4.8 oz (0.136 kg)  Weight trends: Filed Weights   10/11/11 0243 10/23/2011 0500 10/13/11 0500  Weight: 104 lb 11.2 oz (47.492 kg) 91 lb 6.4 oz (41.459 kg) 91 lb 11.2 oz (41.595 kg)    Intake/Output:  10/23/22 0701 - 09/05 0700 In: 243 [P.O.:240; I.V.:3] Out: 750 [Urine:750]     Physical Exam: BP 118/78  Pulse 69  Temp 97.5 F (36.4 C) (Oral)  Resp 16  Ht 5\' 4"  (1.626 m)  Wt 91 lb 11.2 oz (41.595 kg)  BMI 15.74 kg/m2  SpO2 95%  General: Vital signs reviewed and noted.  Elderly, thin,   Head: Normocephalic, atraumatic.  Eyes: conjunctivae/corneas clear.  EOM's intact.   Throat: normal  Neck: Supple. Normal carotids. No JVD  Lungs:  clear  Heart: Regular rate,  With normal  S1 S2. No murmurs, gallops or rubs  Abdomen:  Soft, non-tender, non-distended with normoactive bowel sounds. No hepatomegaly. No rebound/guarding. No abdominal masses.  Extremities: Distal pedal pulses are 2+ .  No edema.  , slightly decreased skin turgur  Neurologic: A&O X3, CN II - XII are grossly intact. Motor strength is 5/5 in the all 4 extremities.  Psych: Responds to questions appropriately with normal affect.    Labs: BMET:  Basename 10/13/11 0440 10-23-11 0432 10/11/11 0315  NA 142 145 --  K 3.8 3.1* --  CL 101 103 --  CO2 33* 34* --  GLUCOSE 80 85 --  BUN 25* 19 --    CREATININE 0.90 0.88 --  CALCIUM 9.6 9.7 --  MG -- -- 1.7  PHOS -- -- --    Liver function tests:  Basename 10/11/11 0315  AST 19  ALT 7  ALKPHOS 54  BILITOT 0.6  PROT 7.4  ALBUMIN 3.3*   No results found for this basename: LIPASE:2,AMYLASE:2 in the last 72 hours  CBC:  Basename 2011/10/23 0432 10/11/11 0315  WBC 7.8 6.8  NEUTROABS -- 4.2  HGB 11.5* 12.1  HCT 35.6* 37.4  MCV 84.4 83.7  PLT 205 207    Cardiac Enzymes:  Basename 10/11/11 0825 10/11/11 0315  CKTOTAL -- --  CKMB -- --  TROPONINI <0.30 <0.30    Coagulation Studies:  Basename 10/13/11 0440 Oct 23, 2011 0432 10/11/11 0315 10/10/11 2020  LABPROT 22.5* 22.1* 20.2* 20.1*  INR 1.94* 1.90* 1.69* 1.68*    Other: No components found with this basename: POCBNP:3  Basename 10/10/11 2020  DDIMER 1.54*   No results found for this basename: HGBA1C in the last 72 hours No results found for this basename: CHOL,HDL,LDLCALC,TRIG,CHOLHDL in the last 72 hours  Basename 10/11/11 0315  TSH 1.017  T4TOTAL --  T3FREE --  THYROIDAB --   No results found for this basename: VITAMINB12,FOLATE,FERRITIN,TIBC,IRON,RETICCTPCT in the last 72 hours  Tele: V paced at 70  Medications:    Infusions:    Scheduled Medications:    . antiseptic oral rinse  15 mL Mouth Rinse BID  . feeding supplement  237 mL Oral BID BM  . feeding supplement  1 Container Oral BID BM  . feeding supplement  30 mL Oral BID WC  . furosemide  20 mg Intravenous Q12H  . hydrocerin   Topical Daily  . lisinopril  5 mg Oral Daily  . metoprolol tartrate  12.5 mg Oral BID  . potassium chloride  40 mEq Oral Once  . sodium chloride  3 mL Intravenous Q12H  . warfarin  1 mg Oral ONCE-1800  . Warfarin - Pharmacist Dosing Inpatient   Does not apply q1800  . DISCONTD: enoxaparin (LOVENOX) injection  40 mg Subcutaneous Q24H    Assessment/ Plan:     Acute diastolic CHF (congestive heart failure) (10/10/2011) Her echo shows moderately reduced LV  function, mild - moderate AI, MR, TR, and PI.  She has moderate - severe pulmonary hypertension.  She is feeling better.  She appears to be stable and may be at her baseline for age of  74.  Would DC IV lasix and resume home meds.  She was not on lasix at home.  She will need to be assessed to see if she needs any diuretic as a home med.  HYPERTENSION (11/01/2006) Better with addition of Lisinopril.  She will need BMP in 1-2 weeks at her medical doctor's office.  Atrial fibrillation (08/22/2011) She is currently V paced.  Continue coumadin   No further recs.  Will sign off. Call for questions.  Disposition:  Length of Stay: 3  Vesta Mixer, Montez Hageman., MD, [facility] 10/13/2011, 8:03 AM Office [phone] Pager [phone]

## 2011-10-24 ENCOUNTER — Telehealth: Payer: Self-pay | Admitting: Internal Medicine

## 2011-10-24 NOTE — Telephone Encounter (Signed)
Caller: Gloria/Child; Patient Name: [place], [place]; PCP: Eleonore Chiquito [facility]); Best Callback Phone Number: [phone]; Reason for call: BP low- Started on several BP medication upon discharge from hospital 2 weeks ago (10/13/11) : Metroprol 25 mgs 1/2 tab PO Q 12 hours and Lisinopril 5 mgs 1 PO QD  and  her BP is low 94/56, 98/64 on 10/21/11- checked by [facility] RN.  Assymptomatic but has been feeling cold on and off. Afebrile. Triage and Care advice per Hypertension Protocol and Advised to Call Provider Within 8 hour for "Sudden drop in blood pressure AND recent change in prescription..." Also, they have not been able to find Amino Acid supplement: Pro Stat 64, sugar free. Please see if there is another substitute that can be called in.

## 2011-10-24 NOTE — Telephone Encounter (Signed)
Pls advise.  

## 2011-10-24 NOTE — Telephone Encounter (Signed)
Please schedule return office visit this week 

## 2011-10-24 NOTE — Telephone Encounter (Signed)
Left a message for return call to schedule rov.

## 2011-10-25 NOTE — Telephone Encounter (Signed)
Pts daughter returned nurses call. Tried to sch pt ov as noted, but was unable to sch at present time. Will call pt later to sch ov.

## 2011-10-27 NOTE — Telephone Encounter (Signed)
Called pts daughter to try scheduling an ov for the pt as noted, but pts daughter said that she was told by Dr Amador Cunas that due to pts frailty and medical condition that it would be too difficult for pt to come in for ovs and that pt should not sch appts now.

## 2011-11-03 DIAGNOSIS — I509 Heart failure, unspecified: Secondary | ICD-10-CM

## 2011-11-03 DIAGNOSIS — F0390 Unspecified dementia without behavioral disturbance: Secondary | ICD-10-CM

## 2011-11-03 DIAGNOSIS — I82629 Acute embolism and thrombosis of deep veins of unspecified upper extremity: Secondary | ICD-10-CM

## 2011-11-03 DIAGNOSIS — E440 Moderate protein-calorie malnutrition: Secondary | ICD-10-CM

## 2011-11-15 ENCOUNTER — Ambulatory Visit: Payer: Medicare Other | Admitting: Internal Medicine

## 2011-11-24 ENCOUNTER — Encounter (HOSPITAL_COMMUNITY): Payer: Self-pay | Admitting: Emergency Medicine

## 2011-11-24 ENCOUNTER — Emergency Department (HOSPITAL_COMMUNITY)
Admission: EM | Admit: 2011-11-24 | Discharge: 2011-11-25 | Disposition: A | Discharge status: Home / Self Care | Payer: Medicare Other | Attending: Emergency Medicine | Admitting: Emergency Medicine

## 2011-11-24 ENCOUNTER — Emergency Department (HOSPITAL_COMMUNITY): Payer: Medicare Other

## 2011-11-24 DIAGNOSIS — F0390 Unspecified dementia without behavioral disturbance: Secondary | ICD-10-CM | POA: Insufficient documentation

## 2011-11-24 DIAGNOSIS — N390 Urinary tract infection, site not specified: Secondary | ICD-10-CM

## 2011-11-24 DIAGNOSIS — I4891 Unspecified atrial fibrillation: Secondary | ICD-10-CM | POA: Insufficient documentation

## 2011-11-24 DIAGNOSIS — Z86718 Personal history of other venous thrombosis and embolism: Secondary | ICD-10-CM | POA: Insufficient documentation

## 2011-11-24 DIAGNOSIS — Z993 Dependence on wheelchair: Secondary | ICD-10-CM | POA: Insufficient documentation

## 2011-11-24 DIAGNOSIS — I10 Essential (primary) hypertension: Secondary | ICD-10-CM | POA: Insufficient documentation

## 2011-11-24 DIAGNOSIS — E785 Hyperlipidemia, unspecified: Secondary | ICD-10-CM | POA: Insufficient documentation

## 2011-11-24 NOTE — ED Provider Notes (Signed)
History     CSN: [id number] Arrival date & time 11/24/11  2236 First MD Initiated Contact with Patient 11/24/11 2305      Chief Complaint  Patient presents with  . Abdominal Pain    Patient is a 78 y.o. female presenting with abdominal pain. The history is provided by the patient.  Abdominal Pain The primary symptoms of the illness include abdominal pain. The primary symptoms of the illness do not include fever, nausea, vomiting, diarrhea or dysuria. The current episode started more than 2 days ago. The onset of the illness was gradual.  Symptoms associated with the illness do not include chills or urgency.  Pt has had UTI's in the past with these symptoms.  Home health came and checked but was unable to get the results.  The pain is vague and intermittent but mostly in the lower abdomen.  Past Medical History  Diagnosis Date  . Hyperlipidemia   . Chronic ulcer of right leg   . HTN (hypertension)   . Symptomatic bradycardia     a. 08/08/2003 s/p MDT [id number] Sigma 300 PPM (VVI), Ser # [id number] H.  . Kyphosis     severe  . Atrial fibrillation   . Dementia   . History of DVT (deep vein thrombosis)     a. 08/2011 acute Right brachial, axillary, and subclavian DVTs.  . Normocytic anemia   . Physical deconditioning     a. wheelchair bound    Past Surgical History  Procedure Date  . Permanent pacemaker     Family History  Problem Relation Age of Onset  . Cancer Son   . Heart failure Daughter   . Other Mother     Deceased "60's" - Old Age  . Other Father     Deceased "69's" - homicide    History  Substance Use Topics  . Smoking status: Never Smoker   . Smokeless tobacco: Current User    Types: Snuff  . Alcohol Use: No    OB History    Grav Para Term Preterm Abortions TAB SAB Ect Mult Living                  Review of Systems  Constitutional: Negative for fever and chills.  Gastrointestinal: Positive for abdominal pain. Negative for nausea, vomiting and diarrhea.   Genitourinary: Negative for dysuria and urgency.  All other systems reviewed and are negative.    Allergies  Review of patient's allergies indicates no known allergies.  Home Medications   Current Outpatient Rx  Name Route Sig Dispense Refill  . ACETAMINOPHEN ER 650 MG PO TBCR Oral Take 325 mg by mouth every 6 (six) hours as needed. For pain    . ENSURE PUDDING PO PUDG Oral Take 1 Container by mouth 2 (two) times daily between meals.    Marland Kitchen LISINOPRIL 5 MG PO TABS Oral Take 1 tablet (5 mg total) by mouth daily. 30 tablet 0  . METOPROLOL TARTRATE 12.5 MG HALF TABLET Oral Take 0.5 tablets (12.5 mg total) by mouth 2 (two) times daily. 60 tablet 0  . WARFARIN SODIUM 1 MG PO TABS Oral Take 1 mg by mouth daily.       BP 145/74  Pulse 69  Temp 97.5 F (36.4 C) (Oral)  Resp 12  Ht 5\' 4"  (1.626 m)  Wt 91 lb (41.277 kg)  BMI 15.62 kg/m2  SpO2 99%  Physical Exam  Nursing note and vitals reviewed. Constitutional: She appears well-developed and well-nourished. No distress.  HENT:  Head: Normocephalic and atraumatic.  Right Ear: External ear normal.  Left Ear: External ear normal.  Eyes: Conjunctivae normal are normal. Right eye exhibits no discharge. Left eye exhibits no discharge. No scleral icterus.  Neck: Neck supple. No tracheal deviation present.  Cardiovascular: Normal rate, regular rhythm and intact distal pulses.   Pulmonary/Chest: Effort normal and breath sounds normal. No stridor. No respiratory distress. She has no wheezes. She has no rales.  Abdominal: Soft. Bowel sounds are normal. She exhibits no distension. There is no tenderness. There is no rebound and no guarding.  Musculoskeletal: She exhibits no edema and no tenderness.  Neurological: She is alert. She has normal strength. No sensory deficit. Cranial nerve deficit:  no gross defecits noted. She exhibits normal muscle tone. She displays no seizure activity. Coordination normal.  Skin: Skin is warm and dry. No rash  noted.  Psychiatric: She has a normal mood and affect.    ED Course  Procedures (including critical care time)  Rate: 70  Rhythm: Electronically paced  QRS Axis: Left l  Intervals: normal  ST/T Wave abnormalities: normal  Conduction Disutrbances:  pacemaker  Narrative Interpretation: Pacemaker  Old EKG Reviewed: No changes Labs Reviewed  COMPREHENSIVE METABOLIC PANEL - Abnormal; Notable for the following:    Potassium 3.0 (*)     Glucose, Bld 118 (*)     BUN 35 (*)     Albumin 3.0 (*)     GFR calc non Af Amer 52 (*)     GFR calc Af Amer 60 (*)     All other components within normal limits  CBC WITH DIFFERENTIAL - Abnormal; Notable for the following:    RDW 17.1 (*)     All other components within normal limits  PROTIME-INR - Abnormal; Notable for the following:    Prothrombin Time 36.2 (*)     INR 3.94 (*)     All other components within normal limits  LIPASE, BLOOD  URINALYSIS, ROUTINE W REFLEX MICROSCOPIC   Dg Abd Acute W/chest  11/24/2011  *RADIOLOGY REPORT*  Clinical Data: Mid abdominal pain for several days.  ACUTE ABDOMEN SERIES (ABDOMEN 2 VIEW & CHEST 1 VIEW)  Comparison: 06/26/2010  Findings: Cardiac enlargement with normal pulmonary vascularity. Lucency and scattered interstitial changes in the lungs suggesting emphysema and scattered fibrosis.  No focal airspace consolidation. No blunting of costophrenic angles.  Calcified and tortuous aorta. Cardiac pacemaker.  Prominent stool in the rectum with scattered gas and stool throughout the colon.  Gas within nondistended small bowel loops. No small or large bowel distension.  No free intra-abdominal air. No abnormal air fluid levels.  Vascular calcifications.  Calcified phleboliths in the pelvis.  No radiopaque stones.  Degenerative changes in the lumbar spine and hips.  The no significant changes since previous study.  IMPRESSION: Emphysema and scattered fibrosis in the lungs.  No evidence of active consolidation.   Nonobstructive bowel gas pattern.   Original Report Authenticated By: Marlon Pel, M.D.      1. Urinary tract infection       MDM  The patient appears to have a recurrent urinary tract infection. I reviewed the culture results from her previous visit. The urinary pathogen was  Escherichia coli that was pansensitive with the exception of nitrofurantoin.  The patient will be given a dose of Rocephin in the emergency room and discharged home on Keflex       Celene Kras, MD 11/25/11 [phone]

## 2011-11-24 NOTE — ED Notes (Signed)
Per EMS. Abd pain 10/10. No pain urinating. No pain on palpitation of abd. Hx of UTI, family reports symptoms consistent with prior complaints of UTI. Home health nurse saw patient today, stated it would be Monday before UA results would be available.

## 2011-11-24 NOTE — ED Notes (Signed)
ZOX:[id number]<AV> Expected date:<BR> Expected time:<BR> Means of arrival:<BR> Comments:<BR> 78 yo with abd pain-hx UTI

## 2011-11-25 LAB — URINALYSIS, ROUTINE W REFLEX MICROSCOPIC
Bilirubin Urine: NEGATIVE
Glucose, UA: NEGATIVE mg/dL
Ketones, ur: NEGATIVE mg/dL
Protein, ur: >300 mg/dL — AB

## 2011-11-25 LAB — CBC WITH DIFFERENTIAL/PLATELET
Basophils Absolute: 0 10*3/uL (ref 0.0–0.1)
Basophils Relative: 0 % (ref 0–1)
Eosinophils Absolute: 0.1 10*3/uL (ref 0.0–0.7)
Eosinophils Relative: 1 % (ref 0–5)
HCT: 40.4 % (ref 36.0–46.0)
MCH: 28.2 pg (ref 26.0–34.0)
MCHC: 33.4 g/dL (ref 30.0–36.0)
MCV: 84.5 fL (ref 78.0–100.0)
Monocytes Absolute: 0.5 10*3/uL (ref 0.1–1.0)
Monocytes Relative: 8 % (ref 3–12)
RDW: 17.1 % — ABNORMAL HIGH (ref 11.5–15.5)

## 2011-11-25 LAB — COMPREHENSIVE METABOLIC PANEL
AST: 18 U/L (ref 0–37)
Alkaline Phosphatase: 50 U/L (ref 39–117)
BUN: 35 mg/dL — ABNORMAL HIGH (ref 6–23)
CO2: 27 mEq/L (ref 19–32)
Chloride: 108 mEq/L (ref 96–112)
Creatinine, Ser: 0.93 mg/dL (ref 0.50–1.10)
GFR calc non Af Amer: 52 mL/min — ABNORMAL LOW (ref >90)
Potassium: 3 mEq/L — ABNORMAL LOW (ref 3.5–5.1)
Total Bilirubin: 0.4 mg/dL (ref 0.3–1.2)

## 2011-11-25 LAB — URINE MICROSCOPIC-ADD ON

## 2011-11-25 MED ORDER — CEPHALEXIN 500 MG PO CAPS: 500.0000 mg | ORAL_CAPSULE | Freq: Three times a day (TID) | ORAL | Status: DC

## 2011-11-25 MED ORDER — DEXTROSE 5 % IV SOLN
1.0000 g | INTRAVENOUS | Status: DC
  Administered 2011-11-25: 1 g via INTRAVENOUS
  Filled 2011-11-25: qty 10

## 2011-11-25 MED ORDER — POTASSIUM CHLORIDE CRYS ER 20 MEQ PO TBCR
40.0000 meq | EXTENDED_RELEASE_TABLET | Freq: Once | ORAL | Status: AC
  Administered 2011-11-25: 40 meq via ORAL
  Filled 2011-11-25: qty 2

## 2011-11-25 NOTE — ED Notes (Signed)
Discharge instructions reviewed for UTI. Rx given x1. All questions answered.

## 2011-11-28 LAB — URINE CULTURE: Colony Count: >=100000

## 2011-11-29 ENCOUNTER — Inpatient Hospital Stay (HOSPITAL_COMMUNITY)
Admission: EM | Admit: 2011-11-29 | Discharge: 2011-12-02 | DRG: 640 | Disposition: A | Discharge status: Home Health | Payer: Medicare Other | Attending: Internal Medicine | Admitting: Internal Medicine

## 2011-11-29 ENCOUNTER — Encounter (HOSPITAL_COMMUNITY): Payer: Self-pay | Admitting: Emergency Medicine

## 2011-11-29 ENCOUNTER — Ambulatory Visit: Payer: Self-pay | Admitting: Family

## 2011-11-29 ENCOUNTER — Emergency Department (HOSPITAL_COMMUNITY): Payer: Medicare Other

## 2011-11-29 ENCOUNTER — Telehealth: Payer: Self-pay | Admitting: Family

## 2011-11-29 DIAGNOSIS — I10 Essential (primary) hypertension: Secondary | ICD-10-CM | POA: Diagnosis present

## 2011-11-29 DIAGNOSIS — I519 Heart disease, unspecified: Secondary | ICD-10-CM | POA: Diagnosis present

## 2011-11-29 DIAGNOSIS — N390 Urinary tract infection, site not specified: Secondary | ICD-10-CM | POA: Diagnosis present

## 2011-11-29 DIAGNOSIS — E870 Hyperosmolality and hypernatremia: Principal | ICD-10-CM | POA: Diagnosis present

## 2011-11-29 DIAGNOSIS — D649 Anemia, unspecified: Secondary | ICD-10-CM | POA: Diagnosis present

## 2011-11-29 DIAGNOSIS — R5381 Other malaise: Secondary | ICD-10-CM | POA: Diagnosis present

## 2011-11-29 DIAGNOSIS — K2901 Acute gastritis with bleeding: Secondary | ICD-10-CM

## 2011-11-29 DIAGNOSIS — Z66 Do not resuscitate: Secondary | ICD-10-CM | POA: Diagnosis present

## 2011-11-29 DIAGNOSIS — Z681 Body mass index (BMI) 19 or less, adult: Secondary | ICD-10-CM

## 2011-11-29 DIAGNOSIS — R2689 Other abnormalities of gait and mobility: Secondary | ICD-10-CM | POA: Diagnosis present

## 2011-11-29 DIAGNOSIS — Z87440 Personal history of urinary (tract) infections: Secondary | ICD-10-CM

## 2011-11-29 DIAGNOSIS — N179 Acute kidney failure, unspecified: Secondary | ICD-10-CM | POA: Diagnosis present

## 2011-11-29 DIAGNOSIS — I5031 Acute diastolic (congestive) heart failure: Secondary | ICD-10-CM

## 2011-11-29 DIAGNOSIS — N189 Chronic kidney disease, unspecified: Secondary | ICD-10-CM | POA: Diagnosis present

## 2011-11-29 DIAGNOSIS — G9341 Metabolic encephalopathy: Secondary | ICD-10-CM | POA: Diagnosis present

## 2011-11-29 DIAGNOSIS — Z79899 Other long term (current) drug therapy: Secondary | ICD-10-CM

## 2011-11-29 DIAGNOSIS — I129 Hypertensive chronic kidney disease with stage 1 through stage 4 chronic kidney disease, or unspecified chronic kidney disease: Secondary | ICD-10-CM | POA: Diagnosis present

## 2011-11-29 DIAGNOSIS — R791 Abnormal coagulation profile: Secondary | ICD-10-CM | POA: Diagnosis present

## 2011-11-29 DIAGNOSIS — Z86718 Personal history of other venous thrombosis and embolism: Secondary | ICD-10-CM

## 2011-11-29 DIAGNOSIS — F068 Other specified mental disorders due to known physiological condition: Secondary | ICD-10-CM | POA: Diagnosis present

## 2011-11-29 DIAGNOSIS — Z7901 Long term (current) use of anticoagulants: Secondary | ICD-10-CM

## 2011-11-29 DIAGNOSIS — Z993 Dependence on wheelchair: Secondary | ICD-10-CM

## 2011-11-29 DIAGNOSIS — F0390 Unspecified dementia without behavioral disturbance: Secondary | ICD-10-CM | POA: Diagnosis present

## 2011-11-29 DIAGNOSIS — Z950 Presence of cardiac pacemaker: Secondary | ICD-10-CM

## 2011-11-29 DIAGNOSIS — R532 Functional quadriplegia: Secondary | ICD-10-CM | POA: Diagnosis present

## 2011-11-29 DIAGNOSIS — I509 Heart failure, unspecified: Secondary | ICD-10-CM

## 2011-11-29 DIAGNOSIS — R0602 Shortness of breath: Secondary | ICD-10-CM

## 2011-11-29 DIAGNOSIS — E785 Hyperlipidemia, unspecified: Secondary | ICD-10-CM | POA: Diagnosis present

## 2011-11-29 DIAGNOSIS — E440 Moderate protein-calorie malnutrition: Secondary | ICD-10-CM | POA: Diagnosis present

## 2011-11-29 DIAGNOSIS — E871 Hypo-osmolality and hyponatremia: Secondary | ICD-10-CM

## 2011-11-29 DIAGNOSIS — R627 Adult failure to thrive: Secondary | ICD-10-CM | POA: Diagnosis present

## 2011-11-29 DIAGNOSIS — M4000 Postural kyphosis, site unspecified: Secondary | ICD-10-CM | POA: Diagnosis present

## 2011-11-29 DIAGNOSIS — Z23 Encounter for immunization: Secondary | ICD-10-CM

## 2011-11-29 DIAGNOSIS — I4891 Unspecified atrial fibrillation: Secondary | ICD-10-CM | POA: Diagnosis present

## 2011-11-29 DIAGNOSIS — R4182 Altered mental status, unspecified: Secondary | ICD-10-CM | POA: Diagnosis present

## 2011-11-29 LAB — BASIC METABOLIC PANEL
BUN: 37 mg/dL — ABNORMAL HIGH (ref 6–23)
Calcium: 9.4 mg/dL (ref 8.4–10.5)
Calcium: 9.1 mg/dL (ref 8.4–10.5)
Chloride: 115 mEq/L — ABNORMAL HIGH (ref 96–112)
Creatinine, Ser: 1.23 mg/dL — ABNORMAL HIGH (ref 0.50–1.10)
Creatinine, Ser: 1.3 mg/dL — ABNORMAL HIGH (ref 0.50–1.10)
GFR calc Af Amer: 43 mL/min — ABNORMAL LOW (ref >90)
GFR calc non Af Amer: 37 mL/min — ABNORMAL LOW (ref >90)
GFR calc non Af Amer: 37 mL/min — ABNORMAL LOW (ref >90)
GFR calc non Af Amer: 35 mL/min — ABNORMAL LOW (ref >90)
Glucose, Bld: 87 mg/dL (ref 70–99)
Glucose, Bld: 85 mg/dL (ref 70–99)
Potassium: 3.7 mEq/L (ref 3.5–5.1)
Sodium: 149 mEq/L — ABNORMAL HIGH (ref 135–145)

## 2011-11-29 LAB — URINE MICROSCOPIC-ADD ON

## 2011-11-29 LAB — CBC
HCT: 42.2 % (ref 36.0–46.0)
Hemoglobin: 13.8 g/dL (ref 12.0–15.0)
MCH: 28 pg (ref 26.0–34.0)
MCHC: 32.7 g/dL (ref 30.0–36.0)

## 2011-11-29 LAB — MAGNESIUM: Magnesium: 2.3 mg/dL (ref 1.5–2.5)

## 2011-11-29 LAB — URINALYSIS, ROUTINE W REFLEX MICROSCOPIC
Glucose, UA: NEGATIVE mg/dL
pH: 5 (ref 5.0–8.0)

## 2011-11-29 MED ORDER — ONDANSETRON HCL 4 MG PO TABS: 4.0000 mg | ORAL_TABLET | Freq: Four times a day (QID) | ORAL | Status: DC | PRN

## 2011-11-29 MED ORDER — ENOXAPARIN SODIUM 30 MG/0.3ML ~~LOC~~ SOLN
30.0000 mg | SUBCUTANEOUS | Status: DC
  Filled 2011-11-29: qty 0.3

## 2011-11-29 MED ORDER — METOPROLOL TARTRATE 12.5 MG HALF TABLET
12.5000 mg | ORAL_TABLET | Freq: Two times a day (BID) | ORAL | Status: DC
  Administered 2011-11-30 – 2011-12-02 (×5): 12.5 mg via ORAL
  Filled 2011-11-29 (×7): qty 1

## 2011-11-29 MED ORDER — SODIUM CHLORIDE 0.9 % IJ SOLN: 3.0000 mL | Freq: Two times a day (BID) | INTRAMUSCULAR | Status: DC

## 2011-11-29 MED ORDER — ENSURE PUDDING PO PUDG
1.0000 | Freq: Two times a day (BID) | ORAL | Status: DC
  Administered 2011-11-30: 1 via ORAL
  Filled 2011-11-29 (×2): qty 1

## 2011-11-29 MED ORDER — ACETAMINOPHEN 325 MG PO TABS: 650.0000 mg | ORAL_TABLET | Freq: Four times a day (QID) | ORAL | Status: DC | PRN

## 2011-11-29 MED ORDER — SODIUM CHLORIDE 0.9 % IV SOLN: INTRAVENOUS | Status: DC

## 2011-11-29 MED ORDER — DEXTROSE-NACL 5-0.45 % IV SOLN
INTRAVENOUS | Status: DC
  Administered 2011-11-29: 16:00:00 via INTRAVENOUS

## 2011-11-29 MED ORDER — ONDANSETRON HCL 4 MG/2ML IJ SOLN: 4.0000 mg | Freq: Four times a day (QID) | INTRAMUSCULAR | Status: DC | PRN

## 2011-11-29 MED ORDER — DEXTROSE-NACL 5-0.45 % IV SOLN
INTRAVENOUS | Status: DC
  Administered 2011-11-29 – 2011-12-01 (×4): via INTRAVENOUS

## 2011-11-29 MED ORDER — LISINOPRIL 5 MG PO TABS
5.0000 mg | ORAL_TABLET | Freq: Every day | ORAL | Status: DC
  Filled 2011-11-29 (×2): qty 1

## 2011-11-29 MED ORDER — WARFARIN - PHARMACIST DOSING INPATIENT: Freq: Every day | Status: DC

## 2011-11-29 MED ORDER — ACETAMINOPHEN ER 650 MG PO TBCR: 325.0000 mg | EXTENDED_RELEASE_TABLET | Freq: Four times a day (QID) | ORAL | Status: DC | PRN

## 2011-11-29 MED ORDER — INFLUENZA VIRUS VACC SPLIT PF IM SUSP
0.5000 mL | INTRAMUSCULAR | Status: AC
  Administered 2011-11-30: 0.5 mL via INTRAMUSCULAR
  Filled 2011-11-29: qty 0.5

## 2011-11-29 NOTE — H&P (Signed)
[facility] History and Physical  [name]:[id number] DOB: 1918/06/24 DOA: 11/29/2011  Referring physician: ED physician PCP: Rogelia Boga, MD   Chief Complaint: Altered mental status  HPI:  Pt is 78 yo female who was brought in by family member from home after found to be more confused and minimally responsive to any stimuli. Please note that this history is obtained form ED records as pt not able to provide any history and there is no family at bedside to help either. Pt is apparently bed bound and has been progressively declining, needing assistance with all daily activities, however, liver at home. She has been eating poorly over the past several days.   Assessment and Plan:  Principal Problem:  *Altered mental status - this is likely multifactorial in etiology and secondary to dehydration, hypernatremia, poor oral intake, progressive failure to thrive and deconditioning - will admit the pt to telemetry floor as she is as far as we know full code - will start IVF and obtain repeat BMP - follow sodium levels  Active Problems:  Dementia - progressive and with failure to thrive - PT evaluation - will discus the family if SF appropriate and ? Palliative care consultation considering progressive decline and functional quadriparesis   Decreased mobility - functional quadriparesis - PT evaluation, will also need SLP and OT   Atrial fibrillation - rate controlled - will try to continue home medications if pt can tolerate PO intake   Hypernatremia - secondary to dehydration and poor oral intake - will provide IVF - recheck BMP in several hours and again in AM   Acute on chronic renal failure - secondary to pre renal etiology and dehydration - will continue IVF as noted above - BMP in AM   Normocytic anemia - stable HG and Hct - CBC in AM   Moderate malnutrition - secondary to progressive failure to thrive - SLP consultation  Code Status:  Full Family Communication: Pt at bedside Disposition Plan: PT evaluation    Review of Systems:  Unable to obtain due to pt's altered mental status  Past Medical History  Diagnosis Date  . Hyperlipidemia   . Chronic ulcer of right leg   . HTN (hypertension)   . Symptomatic bradycardia     a. 08/08/2003 s/p MDT [id number] Sigma 300 PPM (VVI), Ser # [id number] H.  . Kyphosis     severe  . Atrial fibrillation   . Dementia   . History of DVT (deep vein thrombosis)     a. 08/2011 acute Right brachial, axillary, and subclavian DVTs.  . Normocytic anemia   . Physical deconditioning     a. wheelchair bound    Past Surgical History  Procedure Date  . Permanent pacemaker     Social History:  reports that she has never smoked. Her smokeless tobacco use includes Snuff. She reports that she does not drink alcohol or use illicit drugs.  No Known Allergies  Family History  Problem Relation Age of Onset  . Cancer Son   . Heart failure Daughter   . Other Mother     Deceased "34's" - Old Age  . Other Father     Deceased "81's" - homicide    Prior to Admission medications   Medication Sig Start Date End Date Taking? Authorizing Provider  acetaminophen (TYLENOL) 650 MG CR tablet Take 325 mg by mouth every 6 (six) hours as needed. For pain   Yes Historical Provider, MD  cephALEXin (KEFLEX) 500 MG capsule Take 500  mg by mouth 3 (three) times daily. 11/25/11 12/01/11 Yes Celene Kras, MD  feeding supplement (ENSURE) PUDG Take 1 Container by mouth 2 (two) times daily between meals. 08/19/11  Yes Alison Murray, MD  lisinopril (PRINIVIL,ZESTRIL) 5 MG tablet Take 5 mg by mouth daily. 10/13/11 10/12/12 Yes Penny Pia, MD  metoprolol tartrate (LOPRESSOR) 12.5 mg TABS Take 12.5 mg by mouth 2 (two) times daily. 10/13/11  Yes Penny Pia, MD  warfarin (COUMADIN) 1 MG tablet Take 1 mg by mouth daily.  08/22/11 08/21/12 Yes Baker Pierini, FNP    Physical Exam: Filed Vitals:   11/29/11 1154 11/29/11 1212  11/29/11 1349 11/29/11 1539  BP: 141/68   129/71  Pulse: 70   70  Temp: 97.7 F (36.5 C) 98.5 F (36.9 C)    TempSrc: Oral Rectal    Resp: 20   16  SpO2: 96%  98% 98%    Physical Exam  Constitutional: Appears elderly and chronically ill, not in acute distress, nonverbal HENT: Normocephalic. External right and left ear normal. Dry MM Eyes: Conjunctivae and EOM are normal. PERRLA, no scleral icterus.  Neck: Normal ROM. Neck supple. No JVD. No tracheal deviation. No thyromegaly.  CVS: IRRR, , no gallops, no carotid bruit.  Pulmonary: Poor inspiratory effort and decreased breath sounds bilaterally  Abdominal: Soft. BS +,  no distension, tenderness, rebound or guarding.  Musculoskeletal: No edema and no tenderness.  Lymphadenopathy: No lymphadenopathy noted, cervical, inguinal. Neuro: Lethargic and non verbal, not moving any extremities and not responsive to even painful stimuli Skin: Skin is warm and dry. No rash noted. Not diaphoretic. No erythema. No pallor.  Psychiatric: Unable to assess given altered mental status   Labs on Admission:  Basic Metabolic Panel:  Lab 11/29/11 1610 11/25/11 0035  NA 151* 145  K 3.8 3.0*  CL 115* 108  CO2 23 27  GLUCOSE 87 118*  BUN 37* 35*  CREATININE 1.23* 0.93  CALCIUM 9.4 9.4  MG -- --  PHOS -- --   Liver Function Tests:  Lab 11/25/11 0035  AST 18  ALT 13  ALKPHOS 50  BILITOT 0.4  PROT 6.3  ALBUMIN 3.0*    Lab 11/25/11 0035  LIPASE 38  AMYLASE --   CBC:  Lab 11/29/11 1355 11/25/11 0035  WBC 8.6 6.9  NEUTROABS -- 5.2  HGB 13.8 13.5  HCT 42.2 40.4  MCV 85.6 84.5  PLT 198 214    Radiological Exams on Admission: Ct Head Wo Contrast  11/29/2011  *RADIOLOGY REPORT*  Clinical Data: Decreased LOC  CT HEAD WITHOUT CONTRAST  Technique:  Contiguous axial images were obtained from the base of the skull through the vertex without contrast.  Comparison: None.  Findings: No skull fracture is noted.  There is mucosal thickening and  opacification of the left sphenoid sinus.  Atherosclerotic calcifications of carotid siphon are noted.  No intracranial hemorrhage, mass effect or midline shift.  There is moderate cerebral and cerebellar atrophy.  No acute infarction.  No mass lesion is noted on this unenhanced scan.  Periventricular and patchy subcortical white matter decreased attenuation probable due to chronic small vessel ischemic changes.  IMPRESSION: No acute intracranial abnormality.  Moderate cerebral atrophy. Periventricular and subcortical white matter decreased attenuation probable due to chronic small vessel ischemic changes. Atherosclerotic calcifications of carotid siphon are noted. Mucosal thickening and opacification left sphenoid sinus.   Original Report Authenticated By: Natasha Mead, M.D.     EKG: Normal sinus rhythm, no  ST/T wave changes  Debbora Presto, MD  [facility] Pager [phone]  If 7PM-7AM, please contact night-coverage [email or web address] Password [email or web address] 11/29/2011, 4:52 PM

## 2011-11-29 NOTE — ED Notes (Signed)
Bed:WA13<BR> Expected date:<BR> Expected time:<BR> Means of arrival:<BR> Comments:<BR> ems

## 2011-11-29 NOTE — ED Notes (Signed)
Attempted to go in room to In&Out cath patient -- NP is at bedside.

## 2011-11-29 NOTE — ED Provider Notes (Signed)
History     CSN: [id number]  Arrival date & time 11/29/11  1142   First MD Initiated Contact with Patient 11/29/11 1310      Chief Complaint  Patient presents with  . Urinary Tract Infection    (Consider location/radiation/quality/duration/timing/severity/associated sxs/prior treatment) Patient is a 78 y.o. female presenting with urinary tract infection. The history is provided by the patient and a relative. No language interpreter was used.  Urinary Tract Infection This is a recurrent problem. The current episode started in the past 7 days. The problem occurs daily. The problem has been gradually worsening. Associated symptoms include coughing, fatigue, a fever and weakness. Pertinent negatives include no abdominal pain, congestion, headaches, nausea, sore throat or vomiting. The symptoms are aggravated by drinking and swallowing. She has tried nothing for the symptoms. The treatment provided no relief.   78 year old female patient of Dr. Shawnie Pons coming in today with family complaining she is having difficulty swallowing and responding verbally. Patient was seen in the ER 5 days ago and started on Keflex for a UTI. Patient lives with her daughter and daughter states that she was unable to swallow anything for breakfast this morning with increased shortness of breath. States she is increasingly weak and is not responding verbally when she awoke this morning. Patient has become increasingly confused at the last 5 days. Subjective fever. They have no thermometer. The daughter states that she moans and grunts instead of talking now. Started home health in July. Room air sats are 88%. Patient has 15 children and she is a NCB. Past medical history listed below.  Past Medical History  Diagnosis Date  . Hyperlipidemia   . Chronic ulcer of right leg   . HTN (hypertension)   . Symptomatic bradycardia     a. 08/08/2003 s/p MDT [id number] Sigma 300 PPM (VVI), Ser # [id number] H.  . Kyphosis     severe  .  Atrial fibrillation   . Dementia   . History of DVT (deep vein thrombosis)     a. 08/2011 acute Right brachial, axillary, and subclavian DVTs.  . Normocytic anemia   . Physical deconditioning     a. wheelchair bound    Past Surgical History  Procedure Date  . Permanent pacemaker     Family History  Problem Relation Age of Onset  . Cancer Son   . Heart failure Daughter   . Other Mother     Deceased "50's" - Old Age  . Other Father     Deceased "64's" - homicide    History  Substance Use Topics  . Smoking status: Never Smoker   . Smokeless tobacco: Current User    Types: Snuff  . Alcohol Use: No    OB History    Grav Para Term Preterm Abortions TAB SAB Ect Mult Living                  Review of Systems  Unable to perform ROS Constitutional: Positive for fever and fatigue.  HENT: Negative.  Negative for congestion and sore throat.   Eyes: Negative.   Respiratory: Positive for cough. Negative for shortness of breath.   Cardiovascular: Negative.   Gastrointestinal: Negative.  Negative for nausea, vomiting and abdominal pain.  Neurological: Positive for speech difficulty and weakness. Negative for headaches.  Psychiatric/Behavioral: Negative.   All other systems reviewed and are negative.    Allergies  Review of patient's allergies indicates no known allergies.  Home Medications   Current Outpatient Rx  Name  Route Sig Dispense Refill  . ACETAMINOPHEN ER 650 MG PO TBCR Oral Take 325 mg by mouth every 6 (six) hours as needed. For pain    . CEPHALEXIN 500 MG PO CAPS Oral Take 500 mg by mouth 3 (three) times daily.    Marland Kitchen ENSURE PUDDING PO PUDG Oral Take 1 Container by mouth 2 (two) times daily between meals.    Marland Kitchen LISINOPRIL 5 MG PO TABS Oral Take 5 mg by mouth daily.    Marland Kitchen METOPROLOL TARTRATE 12.5 MG HALF TABLET Oral Take 12.5 mg by mouth 2 (two) times daily.    . WARFARIN SODIUM 1 MG PO TABS Oral Take 1 mg by mouth daily.       BP 141/68  Pulse 70  Temp 98.5  F (36.9 C) (Rectal)  Resp 20  SpO2 98%  Physical Exam  Nursing note and vitals reviewed. Constitutional: She is oriented to person, place, and time. She appears well-developed and well-nourished.  HENT:  Head: Normocephalic and atraumatic.  Eyes: Conjunctivae normal and EOM are normal. Pupils are equal, round, and reactive to light.  Neck: Normal range of motion. Neck supple.  Cardiovascular: Normal rate.   Pulmonary/Chest: Effort normal. No respiratory distress. She has decreased breath sounds in the right middle field and the left middle field.  Abdominal: Soft.  Musculoskeletal: Normal range of motion. She exhibits no edema and no tenderness.  Neurological: She is alert and oriented to person, place, and time. She has normal reflexes. GCS eye subscore is 2. GCS verbal subscore is 2. GCS motor subscore is 6.       PEARL follows commands inconsistenly. Question whether she has receptive aphasia. MAE=  Skin: Skin is warm and dry.  Psychiatric: She has a normal mood and affect.    ED Course  Procedures (including critical care time)  Labs Reviewed  CBC - Abnormal; Notable for the following:    RDW 18.1 (*)     All other components within normal limits  PROTIME-INR - Abnormal; Notable for the following:    Prothrombin Time 45.7 (*)     All other components within normal limits  URINALYSIS, ROUTINE W REFLEX MICROSCOPIC  BASIC METABOLIC PANEL   Ct Head Wo Contrast  11/29/2011  *RADIOLOGY REPORT*  Clinical Data: Decreased LOC  CT HEAD WITHOUT CONTRAST  Technique:  Contiguous axial images were obtained from the base of the skull through the vertex without contrast.  Comparison: None.  Findings: No skull fracture is noted.  There is mucosal thickening and opacification of the left sphenoid sinus.  Atherosclerotic calcifications of carotid siphon are noted.  No intracranial hemorrhage, mass effect or midline shift.  There is moderate cerebral and cerebellar atrophy.  No acute  infarction.  No mass lesion is noted on this unenhanced scan.  Periventricular and patchy subcortical white matter decreased attenuation probable due to chronic small vessel ischemic changes.  IMPRESSION: No acute intracranial abnormality.  Moderate cerebral atrophy. Periventricular and subcortical white matter decreased attenuation probable due to chronic small vessel ischemic changes. Atherosclerotic calcifications of carotid siphon are noted. Mucosal thickening and opacification left sphenoid sinus.   Original Report Authenticated By: Natasha Mead, M.D.      No diagnosis found.    MDM  78 year old ncb female here with altered LOC with a sodium of 151. INR is also elevated at 5.4. She has renal insufficiency as well. She will be admitted to hospitalists service for altered LOC and hypernatremia renal insufficiency. Chest x-ray is unremarkable.  Labs Reviewed  URINALYSIS, ROUTINE W REFLEX MICROSCOPIC - Abnormal; Notable for the following:    APPearance CLOUDY (*)     Hgb urine dipstick LARGE (*)     Bilirubin Urine SMALL (*)     Ketones, ur TRACE (*)     Protein, ur >300 (*)     All other components within normal limits  CBC - Abnormal; Notable for the following:    RDW 18.1 (*)     All other components within normal limits  BASIC METABOLIC PANEL - Abnormal; Notable for the following:    Sodium 151 (*)     Chloride 115 (*)     BUN 37 (*)     Creatinine, Ser 1.23 (*)     GFR calc non Af Amer 37 (*)     GFR calc Af Amer 43 (*)     All other components within normal limits  PROTIME-INR - Abnormal; Notable for the following:    Prothrombin Time 45.7 (*)     INR 5.40 (*)     All other components within normal limits  URINE MICROSCOPIC-ADD ON - Abnormal; Notable for the following:    Squamous Epithelial / LPF FEW (*)     Bacteria, UA MANY (*)     Casts HYALINE CASTS (*)  GRANULAR CAST   All other components within normal limits             Remi Haggard, NP 11/29/11  1641

## 2011-11-29 NOTE — ED Notes (Signed)
+   Urine Patient treated with Keflex-sensitive to same-chart appended per protocol MD. 

## 2011-11-29 NOTE — ED Notes (Signed)
Attempted to call report.  Nurse busy.  [name] [phone]

## 2011-11-29 NOTE — ED Notes (Signed)
Unable to do screenings.

## 2011-11-29 NOTE — ED Notes (Signed)
Tubed pt's black tank top and floral shirt to [address]. Confirmed receipt with Ron on [address]

## 2011-11-29 NOTE — Progress Notes (Signed)
ANTICOAGULATION CONSULT NOTE - Initial Consult  Pharmacy Consult for Warfarin Indication: atrial fibrillation & h/o DVT (July 2013)  No Known Allergies  Patient Measurements: Weight = 41.3 kg (11/1711) Height = 64 inches (11/24/11)   Vital Signs: Temp: 98.5 F (36.9 C) (10/22 1212) Temp src: Rectal (10/22 1212) BP: 129/71 mmHg (10/22 1539) Pulse Rate: 70  (10/22 1539)  Labs:  [facility] 11/29/11 1355  HGB 13.8  HCT 42.2  PLT 198  APTT --  LABPROT 45.7*  INR 5.40*  HEPARINUNFRC --  CREATININE 1.23*  CKTOTAL --  CKMB --  TROPONINI --    The CrCl is unknown because both a height and weight (above a minimum accepted value) are required for this calculation.   Medical History: Past Medical History  Diagnosis Date  . Hyperlipidemia   . Chronic ulcer of right leg   . HTN (hypertension)   . Symptomatic bradycardia     a. 08/08/2003 s/p MDT [id number] Sigma 300 PPM (VVI), Ser # [id number] H.  . Kyphosis     severe  . Atrial fibrillation   . Dementia   . History of DVT (deep vein thrombosis)     a. 08/2011 acute Right brachial, axillary, and subclavian DVTs.  . Normocytic anemia   . Physical deconditioning     a. wheelchair bound    Medications:  Scheduled:    . feeding supplement  1 Container Oral BID BM  . influenza  inactive virus vaccine  0.5 mL Intramuscular Tomorrow-1000  . lisinopril  5 mg Oral Daily  . metoprolol tartrate  12.5 mg Oral BID  . sodium chloride  3 mL Intravenous Q12H  . Warfarin - Pharmacist Dosing Inpatient   Does not apply q1800  . DISCONTD: enoxaparin (LOVENOX) injection  30 mg Subcutaneous Q24H   Infusions:    . sodium chloride    . DISCONTD: dextrose 5 % and 0.45% NaCl 50 mL/hr at 11/29/11 1538    Assessment:  78 year old female on warfarin 1 mg daily PTA for atrial fibrillation and a DVT (July 2013). Pt with permanent pacemaker  Last dose of warfarin 1mg  noted as taken on 11/28/11  INR upon admission = 5.4  Pt presents to ED  with UTI  Elevated Na (151) and Scr (1.23)  Goal of Therapy:  INR goal per [place] records = 2.5-3.5   Plan:   Spoke with Dr Izola Price and d/c'ed order for Lovenox due to elevated INR  No warfarin tonight  Check daily PT/INR  Amadea Keagy, Joselyn Glassman, PharmD 11/29/2011,5:06 PM

## 2011-11-29 NOTE — ED Notes (Addendum)
Pt's daughter states that she was here about a week ago and was dx with a UTI.  Pt was given cephalexin.  Has not finished the course of abx but has been taking it regularly.  Pt's family states that she has been increasingly weak and not talking like herself and also "spitting up mucous".  When attempting to talk to pt, pt just mumbles.

## 2011-11-29 NOTE — Telephone Encounter (Signed)
Patient overdue for INR check. Please schedule.

## 2011-11-29 NOTE — Telephone Encounter (Signed)
Spoke with pt's granddaughter, Marylene Land to find out that pt has been rushed to the hospital because "her inr is really high and her sodium level is really high." pt is currently being admitted. Granddaughter also states that pt's coumadin has been managed by Dr. Redmond School with home healthcare due to pt inability to come into the office. According to Marylene Land, pt is no longer a pt of  Dr. Kirtland Bouchard

## 2011-11-29 NOTE — ED Notes (Signed)
Family reports patient was seen 3 weeks ago for a UTI and needs to be treated for the same.  EMS reports patient is warm to touch.  Patient has h/o dementia and has no complaints.  Patient's family hasn't seen enough improvement in her status, so they would like for her to be re-evaluated today.

## 2011-11-30 DIAGNOSIS — R791 Abnormal coagulation profile: Secondary | ICD-10-CM

## 2011-11-30 DIAGNOSIS — R4182 Altered mental status, unspecified: Secondary | ICD-10-CM

## 2011-11-30 LAB — PROTIME-INR: INR: 6.73 (ref 0.00–1.49)

## 2011-11-30 LAB — GLUCOSE, CAPILLARY

## 2011-11-30 MED ORDER — DEXTROSE 5 % IV SOLN
1.0000 g | Freq: Every day | INTRAVENOUS | Status: DC
  Administered 2011-11-30 – 2011-12-02 (×3): 1 g via INTRAVENOUS
  Filled 2011-11-30 (×3): qty 10

## 2011-11-30 MED ORDER — ENSURE COMPLETE PO LIQD
237.0000 mL | Freq: Two times a day (BID) | ORAL | Status: DC
  Administered 2011-12-01: 237 mL via ORAL

## 2011-11-30 MED ORDER — PHYTONADIONE 5 MG PO TABS
5.0000 mg | ORAL_TABLET | Freq: Once | ORAL | Status: AC
  Administered 2011-11-30: 5 mg via ORAL
  Filled 2011-11-30: qty 1

## 2011-11-30 MED ORDER — ENSURE PUDDING PO PUDG
1.0000 | Freq: Two times a day (BID) | ORAL | Status: DC | PRN
  Filled 2011-11-30: qty 1

## 2011-11-30 MED ORDER — HYDROCERIN EX CREA
TOPICAL_CREAM | Freq: Every day | CUTANEOUS | Status: DC
  Administered 2011-11-30 – 2011-12-02 (×3): via TOPICAL
  Filled 2011-11-30: qty 113

## 2011-11-30 NOTE — Clinical Documentation Improvement (Signed)
CHANGE MENTAL STATUS DOCUMENTATION CLARIFICATION   THIS DOCUMENT IS NOT A PERMANENT PART OF THE MEDICAL RECORD  TO RESPOND TO THE THIS QUERY, FOLLOW THE INSTRUCTIONS BELOW:  1. If needed, update documentation for the patient's encounter via the notes activity.  2. Access this query again and click edit on the In [employer].  3. After updating, or not, click F2 to complete all highlighted (required) fields concerning your review. Select "additional documentation in the medical record" OR "no additional documentation provided".  4. Click Sign note button.  5. The deficiency will fall out of your In Basket *Please let us know if you are not able to complete this workflow by phone or e-mail (listed below).         11/30/11  Dear Dr. Dorris Carnes [facility]  In an effort to better capture your patient's severity of illness, reflect appropriate length of stay and utilization of resources, a review of the patient medical record has revealed the following indicators.    Based on your clinical judgment, please clarify and document in a progress note and/or discharge summary the clinical condition associated with the following supporting information:  In responding to this query please exercise your independent judgment.  The fact that a query is asked, does not imply that any particular answer is desired or expected. 11/29/11 H&P..." *Altered mental status- this is likely multifactorial in etiology and secondary to dehydration, hypernatremia, poor oral intake, progressive failure to thrive and deconditioning..." For accurate DX specificity & severity can noted "AMS" be further specified w/ clinical cond being eval'd, mon'd & tx'd.  Thank you  Possible Clinical Conditions?  Encephalopathy (describe type if known) - Anoxic - Septic - Alcoholic  - Hepatic - Hypertensive - Metabolic -Toxic  Drug induced confusion/delirium Acute confusion Acute delirium  Acute exacerbation of known  dementia (indicate type) New diagnosis of Dementia, Alzheimer's, cerebral atherosclerosis  Hyponatremia / Hypernatremia Poisoning / Overdose Hypoxemia / Hypoxia  Other Condition (please specify) Cannot Clinically Determine  Supporting Information: Risk Factors: 10/222/13: H&P.Marland KitchenMarland Kitchen"Pt is apparently bed bound and has been progressively declining, needing assistance with all daily activities"..." has been eating poorly over the past several days"...  Signs & Symptoms: 11/29/11 H&P..." *Altered mental status- this is likely multifactorial in etiology and secondary to dehydration, hypernatremia, poor oral intake, progressive failure to thrive and deconditioning..."  Diagnostics: 11/29/11 @1355   NA 151*  K 3.8  CL 115*  CO2 23  GLUCOSE 87  BUN 37*  CREATININE 1.23*   Radiology: CT scan 11/29/11: IMPRESSION: No acute intracranial abnormality. Moderate cerebral atrophy  Treatment: 11/29/11 H&P "...telemetry floor - will start IVF and obtain repeat BMP- follow sodium levels"...   Reviewed:  Thank You,  Toribio Harbour, RN, BSN, CCDS Certified Clinical Documentation Specialist Pager: [phone]  Health Information Management [place]

## 2011-11-30 NOTE — Evaluation (Signed)
Occupational Therapy Evaluation Patient Details Name: [name] MRN: [id number] DOB: February 26, 1918 Today's Date: 11/30/2011 Time: 3086-5784 OT Time Calculation (min): 35 min  OT Assessment / Plan / Recommendation Clinical Impression  Pt is a 78 yo female admitted from home with AMS. Pt is extremely weak and deconditioned, supposedly cared for by family members. Pt lives only with her young grandson. Feel pt will need snf at d/c. Skilled OT indicated to maximzie independence with BADLS to decrerase burden of care at next venue.    OT Assessment  Patient needs continued OT Services (trial only 2* cognitive deficits.)    Follow Up Recommendations  Skilled nursing facility;Supervision/Assistance - 24 hour    Barriers to Discharge Inaccessible home environment;Decreased caregiver support    Equipment Recommendations  3 in 1 bedside comode    Recommendations for Other Services    Frequency  Min 1X/week    Precautions / Restrictions Precautions Precautions: Fall Precaution Comments: ?issues with swallowing, pt spitting up white substance during PT/OT eval; RN notified   Pertinent Vitals/Pain Denied pain.    ADL  Grooming: Performed;Wash/dry face;Maximal assistance Where Assessed - Grooming: Unsupported sitting Toilet Transfer: +2 Total assistance;Performed Toilet Transfer: Patient Percentage: 10% Toilet Transfer Method: Stand pivot Acupuncturist: Other (comment) (to recliner) Toileting - Clothing Manipulation and Hygiene: Performed;+2 Total assistance Toileting - Clothing Manipulation and Hygiene: Patient Percentage: 0% Where Assessed - Toileting Clothing Manipulation and Hygiene: Sit to stand from 3-in-1 or toilet Transfers/Ambulation Related to ADLs: Grandson very vague about what pt was and was not able to do. At one point he stated she didnt walk then stated that she walked from the bed to recliner with [facility] assist. ADL Comments: Pt extremely weak and  deconditioned. Lucila Maine states that pt spends the day in a recliner wearing an adult diaper. Pt with a HH nurse who checks on her daily. Pt also with a sacral wound.    OT Diagnosis: Generalized weakness  OT Problem List: Decreased strength;Decreased range of motion;Decreased activity tolerance;Decreased knowledge of use of DME or AE;Impaired balance (sitting and/or standing);Decreased safety awareness;Decreased cognition OT Treatment Interventions: Self-care/ADL training;Therapeutic activities;DME and/or AE instruction;Patient/family education   OT Goals Acute Rehab OT Goals OT Goal Formulation: Patient unable to participate in goal setting Time For Goal Achievement: 12/14/11 Potential to Achieve Goals: Fair ADL Goals Pt Will Perform Eating: with set-up;Supine, head of bed up;Sitting, chair;Supported ADL Goal: Eating - Progress: Goal set today Miscellaneous OT Goals Miscellaneous OT Goal #1: Pt will complete supine>sit with mod A as a precurser for ADLs. OT Goal: Miscellaneous Goal #1 - Progress: Goal set today Miscellaneous OT Goal #2: Pt will sit unsupported EOB with SBA in prep for seated ADL. OT Goal: Miscellaneous Goal #2 - Progress: Goal set today  Visit Information  Last OT Received On: 11/30/11 Assistance Needed: +2 PT/OT Co-Evaluation/Treatment: Yes    Subjective Data  Subjective: Pt made minimal verbalizations that were difficult to understand. Patient Stated Goal: Pt unable to state goal.   Prior Functioning     Home Living Lives With: Other (Comment) (grandson) Available Help at Discharge: Family;Available 24 hours/day Type of Home: Apartment Home Access: Stairs to enter Entrance Stairs-Number of Steps: 4 Entrance Stairs-Rails: None Home Layout: One level Home Adaptive Equipment: Hospital bed;Wheelchair - manual Additional Comments: grandson giving conflicting info regarding pt PLOF Prior Function Level of Independence: Needs assistance Needs Assistance:  Bathing;Dressing;Grooming;Meal Prep;Toileting;Light Housekeeping Bath: Total Dressing: Total Grooming: Total Toileting: Total Meal Prep: Total Light Housekeeping: Total Driving: No  Vocation: Retired Comments: Pt's grandson arrived at the end of session. Communication Communication: Expressive difficulties Dominant Hand: Right         Vision/Perception     Cognition  Overall Cognitive Status: Impaired Area of Impairment: Memory;Safety/judgement;Awareness of errors;Awareness of deficits Arousal/Alertness: Awake/alert Orientation Level: Disoriented to;Place;Time;Situation Behavior During Session: WFL for tasks performed Memory: Decreased recall of precautions Safety/Judgement: Decreased awareness of safety precautions Awareness of Errors: Assistance required to identify errors made;Assistance required to correct errors made    Extremity/Trunk Assessment Right Upper Extremity Assessment RUE ROM/Strength/Tone: Unable to fully assess;Due to impaired cognition Left Upper Extremity Assessment LUE ROM/Strength/Tone: Unable to fully assess;Due to impaired cognition Right Lower Extremity Assessment RLE ROM/Strength/Tone: Deficits RLE ROM/Strength/Tone Deficits: grossly 2+ to 3/5; AAROM WFL except ankle--uanble to pf/df Left Lower Extremity Assessment LLE ROM/Strength/Tone: Deficits LLE ROM/Strength/Tone Deficits: grossly 2+ to 3/5; AAROM grossly WFL Trunk Assessment Trunk Assessment: Kyphotic     Mobility Bed Mobility Bed Mobility: Supine to Sit Supine to Sit: 1: +2 Total assist Supine to Sit: Patient Percentage: 0% Transfers Sit to Stand: 1: +2 Total assist;From bed;From chair/3-in-1 Stand to Sit: 1: +2 Total assist;To chair/3-in-1 Stand to Sit: Patient Percentage: 0% Details for Transfer Assistance: assist for wt shift, LE support and trunk support     Shoulder Instructions     Exercise     Balance Static Sitting Balance Static Sitting - Balance Support: Right  upper extremity supported;Feet unsupported (pt unable to keep feet on floor) Static Sitting - Level of Assistance: 4: Min assist;3: Mod assist;5: Stand by assistance Static Sitting - Comment/# of Minutes: 5   End of Session OT - End of Session Equipment Utilized During Treatment: Gait belt Activity Tolerance: Patient limited by fatigue Patient left: in chair;with call bell/phone within reach;with family/visitor present Nurse Communication: Mobility status;Need for lift equipment  GO     Aloura Matsuoka A OTR/L [phone] 11/30/2011, 11:09 AM

## 2011-11-30 NOTE — Progress Notes (Addendum)
[facility] PROGRESS NOTE  [name]:[id number] DOB: December 17, 1918 DOA: 11/29/2011 PCP: Rogelia Boga, MD  Assessment/Plan:  *Altered mental status  -likely multifactorial  secondary to dehydration, hypernatremia, poor oral intake, progressive failure to thrive and deconditioning  - continue IVF with D5  1/2 NS. Hypernatremia slowly improving - follow labs. Am labs pending -swallow eval -UA suggests UTI. Check urine cx. Started on IV rocephin    Dementia with poor mobility - progressive and with failure to thrive  - PT evaluation  - will discuss with family re goals of care   Atrial fibrillation  - rate controlled . On coumadin with supratheraperutic INR. On hold . Will follow -resume BB once  cleared by swallow  Hypernatremia  - secondary to dehydration and poor oral intake  - cotn IV fluids as above. Recheck labs in am  Acute on chronic renal failure  - secondary to pre renal etiology and dehydration . Also on lasix at home - will continue IVF as noted above  - BMP in AM    Diastolic dysfn On lasix at home. Appears dehydrated. Hold lasix and continue gentle hydration   Normocytic anemia  - stable - CBC in AM   Malnutrition ( mild to moderate) Secondary to poor po intake  SLP eval followed by nutrition consult      Code Status:full Family Communication: grandson at bedside. Will d/x further with her daughter today re: goals of care Disposition Plan: pending PT eval   Consultants:  none  Procedures:  none  Antibiotics:  IV rocephin 9 day 1)  HPI/Subjective: Patient seen and examined. H&P reviewed. grandson at bedside. informs she has been having poor po intake for a week  and more confused.   Objective: Filed Vitals:   11/29/11 1734 11/29/11 1743 11/29/11 2128 11/30/11 0508  BP:  115/63 123/63 112/53  Pulse:  75 71 70  Temp:  97.7 F (36.5 C) 97.2 F (36.2 C) 98 F (36.7 C)  TempSrc:  Axillary Axillary Oral  Resp:   18 18 18   Height: 5\' 4"  (1.626 m)     Weight: 43.999 kg (97 lb)     SpO2:  99% 99% 99%    Intake/Output Summary (Last 24 hours) at 11/30/11 1110 Last data filed at 11/30/11 0900  Gross per 24 hour  Intake    645 ml  Output      0 ml  Net    645 ml   Filed Weights   11/29/11 1734  Weight: 43.999 kg (97 lb)    Exam:   General: appears frail, in NAD  HEENT: no pallor, dry oral mucosa  Cardiovascular: * N S1& S2, no murmurs  Respiratory: clear b/l no added sounds  Abdomen: soft, NT ND BS+  Ext: warm, no edema  CNS; AAOX 1 ( oriented to self), non focal  Data Reviewed: Basic Metabolic Panel:  Lab 11/29/11 8119 11/29/11 1835 11/29/11 1355 11/25/11 0035  NA 149* 150* 151* 145  K 4.2 3.7 3.8 3.0*  CL 113* 115* 115* 108  CO2 28 25 23 27   GLUCOSE 85 83 87 118*  BUN 35* 35* 37* 35*  CREATININE 1.30* 1.23* 1.23* 0.93  CALCIUM 9.1 9.1 9.4 9.4  MG -- 2.3 -- --  PHOS -- 3.8 -- --   Liver Function Tests:  Lab 11/25/11 0035  AST 18  ALT 13  ALKPHOS 50  BILITOT 0.4  PROT 6.3  ALBUMIN 3.0*    Lab 11/25/11 0035  LIPASE 38  AMYLASE --   No results found for this basename: AMMONIA:5 in the last 168 hours CBC:  Lab 11/29/11 1355 11/25/11 0035  WBC 8.6 6.9  NEUTROABS -- 5.2  HGB 13.8 13.5  HCT 42.2 40.4  MCV 85.6 84.5  PLT 198 214   Cardiac Enzymes: No results found for this basename: CKTOTAL:5,CKMB:5,CKMBINDEX:5,TROPONINI:5 in the last 168 hours BNP (last 3 results)  Basename 10/12/11 0432 10/11/11 0825 10/10/11 2020  PROBNP 19881.0* 15820.0* 9771.0*   CBG:  Lab 11/30/11 0807  GLUCAP 66*    Recent Results (from the past 240 hour(s))  URINE CULTURE     Status: Normal   Collection Time   11/25/11  2:32 AM      Component Value Range Status Comment   Specimen Description URINE, CATHETERIZED   Final    Special Requests NONE   Final    Culture  Setup Time 11/25/2011 09:21   Final    Colony Count >=100,000 COLONIES/ML   Final    Culture PROTEUS  MIRABILIS   Final    Report Status 11/28/2011 FINAL   Final    Organism ID, Bacteria PROTEUS MIRABILIS   Final      Studies: Ct Head Wo Contrast  11/29/2011  *RADIOLOGY REPORT*  Clinical Data: Decreased LOC  CT HEAD WITHOUT CONTRAST  Technique:  Contiguous axial images were obtained from the base of the skull through the vertex without contrast.  Comparison: None.  Findings: No skull fracture is noted.  There is mucosal thickening and opacification of the left sphenoid sinus.  Atherosclerotic calcifications of carotid siphon are noted.  No intracranial hemorrhage, mass effect or midline shift.  There is moderate cerebral and cerebellar atrophy.  No acute infarction.  No mass lesion is noted on this unenhanced scan.  Periventricular and patchy subcortical white matter decreased attenuation probable due to chronic small vessel ischemic changes.  IMPRESSION: No acute intracranial abnormality.  Moderate cerebral atrophy. Periventricular and subcortical white matter decreased attenuation probable due to chronic small vessel ischemic changes. Atherosclerotic calcifications of carotid siphon are noted. Mucosal thickening and opacification left sphenoid sinus.   Original Report Authenticated By: Natasha Mead, M.D.     Scheduled Meds:   . cefTRIAXone (ROCEPHIN)  IV  1 g Intravenous Daily  . feeding supplement  1 Container Oral BID BM  . influenza  inactive virus vaccine  0.5 mL Intramuscular Tomorrow-1000  . metoprolol tartrate  12.5 mg Oral BID  . sodium chloride  3 mL Intravenous Q12H  . Warfarin - Pharmacist Dosing Inpatient   Does not apply q1800  . DISCONTD: enoxaparin (LOVENOX) injection  30 mg Subcutaneous Q24H  . DISCONTD: lisinopril  5 mg Oral Daily   Continuous Infusions:   . dextrose 5 % and 0.45% NaCl 50 mL/hr at 11/30/11 0857  . DISCONTD: sodium chloride    . DISCONTD: dextrose 5 % and 0.45% NaCl 50 mL/hr at 11/29/11 1538       Time spent: 30 MINUTES    Cristhian Vanhook  [facility] Pager [phone]. If 8PM-8AM, please contact night-coverage at [email or web address], password [facility] 11/30/2011, 11:10 AM  LOS: 1 day

## 2011-11-30 NOTE — Progress Notes (Signed)
CRITICAL VALUE ALERT  Critical value received:  INR 6.73  Date of notification: 11/30/2011  Time of notification:  1558  Critical value read back:yes  Nurse who received alert:  Derinda Sis  MD notified (1st page): Dr. Gonzella Lex  Time of first page: 1558  MD notified (2nd page):  Time of second page:  Responding MD: Dr. Gonzella Lex  Time MD responded: [phone]

## 2011-11-30 NOTE — Consult Note (Signed)
WOC consult Note Reason for Consult:R malleolus ulcer (healed), scalp lesions, sacral ulcer Wound type: Healed venous insufficiency ulcer, partial thickness moisture associated skin damage (NOT pressure), unknown etiology of scalp lesions Pressure Ulcer POA: No Measurement: Numerous scalp lesions, etiology not known, clustered together to cover crown.  May wish to consult with dermatology for definitive diagnosis.  Patient and family state that they have been there for a long time, no pain, but lesions do occasionally "itch". Small, partial thickness skin damage secondary to incontinence (.5cm round x 0.2cm). Drainage (amount, consistency, odor) None Periwound:Intact Dressing procedure/placement/frequency:Eucerin cream to bilateral LEs daily after bath.  Soft silicone foam dressing to buttocks wound.  Geomatt chair pad for pressure redistribution when OOB.  Wash scalp with shampoo cap today.  Suggest derm consult if desired for these lesions. I will not follow.  Please re-consult if needed. Thanks, Ladona Mow, MSN, RN, [facility], CWOCN [phone])

## 2011-11-30 NOTE — Progress Notes (Signed)
   CARE MANAGEMENT NOTE 11/30/2011  Patient:  [name],[name]   Account Number:  [id number]  Date Initiated:  11/30/2011  Documentation initiated by:  Jiles Crocker  Subjective/Objective Assessment:   ADMITTED WITH WEAKNESS     Action/Plan:   PCP: Rogelia Boga, MD  LIVES WITH DAUGHTER, HAS [facility] FOR [facility] NEEDS   Anticipated DC Date:  12/07/2011   Anticipated DC Plan:  HOME W HOME HEALTH SERVICES  In-house referral  Clinical Social Worker      DC Planning Services  CM consult          Status of service:  In process, will continue to follow Medicare Important Message given?  NA - LOS <3 / Initial given by admissions (If response is "NO", the following Medicare IM given date fields will be blank) Per UR Regulation:  Reviewed for med. necessity/level of care/duration of stay  Comments:  11/30/2011- B Arretta Toenjes RN, BSN, MHA

## 2011-11-30 NOTE — Progress Notes (Signed)
INITIAL ADULT NUTRITION ASSESSMENT Date: 11/30/2011   Time: 4:43 PM Reason for Assessment: Low [place], decreased BMI  ASSESSMENT: Female 78 y.o.  Dx: Altered mental status, healed venous insufficiency ulcer, partial thickness moisture associated skin damage, unknown etiology of scalp lesion  Hx:  Past Medical History  Diagnosis Date  . Hyperlipidemia   . Chronic ulcer of right leg   . HTN (hypertension)   . Symptomatic bradycardia     a. 08/08/2003 s/p MDT [id number] Sigma 300 PPM (VVI), Ser # [id number] H.  . Kyphosis     severe  . Atrial fibrillation   . Dementia   . History of DVT (deep vein thrombosis)     a. 08/2011 acute Right brachial, axillary, and subclavian DVTs.  . Normocytic anemia   . Physical deconditioning     a. wheelchair bound   Past Surgical History  Procedure Date  . Permanent pacemaker     Related Meds:     . cefTRIAXone (ROCEPHIN)  IV  1 g Intravenous Daily  . feeding supplement  1 Container Oral BID BM  . hydrocerin   Topical Daily  . influenza  inactive virus vaccine  0.5 mL Intramuscular Tomorrow-1000  . metoprolol tartrate  12.5 mg Oral BID  . phytonadione  5 mg Oral Once  . sodium chloride  3 mL Intravenous Q12H  . Warfarin - Pharmacist Dosing Inpatient   Does not apply q1800  . DISCONTD: enoxaparin (LOVENOX) injection  30 mg Subcutaneous Q24H  . DISCONTD: lisinopril  5 mg Oral Daily     Ht: 5\' 4"  (162.6 cm)  Wt: 97 lb (43.999 kg)  Ideal Wt: 54.7 kg  % Ideal Wt: 80  Usual Wt:  Wt Readings from Last 10 Encounters:  11/29/11 97 lb (43.999 kg)  11/24/11 91 lb (41.277 kg)  10/13/11 91 lb 11.2 oz (41.595 kg)  08/12/11 107 lb (48.535 kg)  08/03/11 107 lb 9.4 oz (48.8 kg)    % Usual Wt: 91% weight 3 months ago  Body mass index is 16.65 kg/(m^2).-underweight  Labs:  CMP     Component Value Date/Time   NA 149* 11/29/2011 2214   K 4.2 11/29/2011 2214   CL 113* 11/29/2011 2214   CO2 28 11/29/2011 2214   GLUCOSE 85 11/29/2011 2214   BUN 35* 11/29/2011 2214   CREATININE 1.30* 11/29/2011 2214   CALCIUM 9.1 11/29/2011 2214   PROT 6.3 11/25/2011 0035   ALBUMIN 3.0* 11/25/2011 0035   AST 18 11/25/2011 0035   ALT 13 11/25/2011 0035   ALKPHOS 50 11/25/2011 0035   BILITOT 0.4 11/25/2011 0035   GFRNONAA 35* 11/29/2011 2214   GFRAA 40* 11/29/2011 2214    I/O last 3 completed shifts: In: 645 [I.V.:645] Out: -  Total I/O In: 120 [P.O.:120] Out: -    Diet Order: Dysphagia 1 thin with extra gravy/sauce  Supplements/Tube Feeding:  Ensure Pudding bid  IVF:    dextrose 5 % and 0.45% NaCl Last Rate: 50 mL/hr at 11/30/11 0857  DISCONTD: sodium chloride   DISCONTD: dextrose 5 % and 0.45% NaCl Last Rate: 50 mL/hr at 11/29/11 1538    Estimated Nutritional Needs:   Kcal: 1400-1600 Protein: 70-80 gm Fluid: 1.2-1.3L  Food/Nutrition Related Hx: Spoke with daughter who is living and caring for pt.  Intake fair until this last week when pt experienced some dysphagia.  Pt was pocketing foods, spillage, delayed swallowing consistent with dementia per SLP today.  Pt now on a pureed diet with 10-25% intake.  Does take Ensure pudding at times.  Drinks 1 bottle of Ensure daily at home. Weight is 9% less from 3 months ago.  Pt underweight with decreased body fat and muscle mass.  Intake <75% for >1 month.   Pt meets criteria for severe malnutrition related to chronic illness.  NUTRITION DIAGNOSIS: -Inadequate oral intake (NI-2.1).  Status: Ongoing  RELATED TO: dysphagia and decreased appetite  AS EVIDENCE BY: documentation and report  MONITORING/EVALUATION(Goals): Intake, labs, weight Goal:  Intake of >50% meals and supplements  EDUCATION NEEDS: -No education needs identified at this time  INTERVENTION: Dysphagia 1 thin liquid diet Ensure pudding bid Ensure liquid bid  Dietitian #:[phone]  DOCUMENTATION CODES Per approved criteria  -Severe malnutrition in the context of chronic illness -Underweight    Carrol Bondar,  Anastasia Fiedler 11/30/2011, 4:43 PM

## 2011-11-30 NOTE — Evaluation (Signed)
Clinical/Bedside Swallow Evaluation Patient Details  Name: [name] MRN: [id number] Date of Birth: April 05, 1918  Today's Date: 11/30/2011 Time: 2130-8657 SLP Time Calculation (min): 58 min  Past Medical History:  Past Medical History  Diagnosis Date  . Hyperlipidemia   . Chronic ulcer of right leg   . HTN (hypertension)   . Symptomatic bradycardia     a. 08/08/2003 s/p MDT [id number] Sigma 300 PPM (VVI), Ser # [id number] H.  . Kyphosis     severe  . Atrial fibrillation   . Dementia   . History of DVT (deep vein thrombosis)     a. 08/2011 acute Right brachial, axillary, and subclavian DVTs.  . Normocytic anemia   . Physical deconditioning     a. wheelchair bound   Past Surgical History:  Past Surgical History  Procedure Date  . Permanent pacemaker    HPI:  78 yo female adm to [facility] with AMS, weakness, cough, fatigue.  Per admit note pt's cough is exacerbated by eating and drinking.  PMH + for kyphosis, HLD, ulcer right leg chronic, dementia, weakness-wheelchair bound, DVT, anemia.  Pt resides with daughter [name] and grandson [name].  [name] reports he works but his mother is home 24/7 with pt.  Per grandson, pt's intake has been poor over the last month.  Per daughter [name] who arrived at end of sesion), pt with oral holding and coughing, especially recently.     Assessment / Plan / Recommendation Clinical Impression  Pt presents with dysphagia consistent with dementia characterized by oral holding, anterior labial spillage, delayed swallow.   Cough x1 only with solid egg residuals in oral cavity -? premature spillage of eggs into pharynx/larynx- .  Pt's kyphosis may contribute to esophageal clearance issues. Eating is laborious for this pt and the pt was unable to answer if po poor due to lack of appetitie or difficulty eating.  Rec puree/thin diet with strict precautions - Family [name] and [place]) informed and educated to assure pt swallows before taking more, to check for oral  stasis and to diet modifications recommended- both agreeable.   Note pt receiving Ensure pudding, ? if Ensure liquid may be easier for her to consume.  SLP to follow up for education and tolerance.  Thanks for the consult.     Aspiration Risk  Moderate    Diet Recommendation Dysphagia 1 (Puree);Thin liquid   Liquid Administration via: Cup Medication Administration: Crushed with puree (or whole) Supervision: Patient able to self feed;Full supervision/cueing for compensatory strategies;Trained caregiver to feed patient (educated [place] to precautions) Compensations: Slow rate;Small sips/bites;Check for pocketing Postural Changes and/or Swallow Maneuvers: Seated upright 90 degrees;Upright 30-60 min after meal    Other  Recommendations Oral Care Recommendations: Oral care before and after PO   Follow Up Recommendations       Frequency and Duration min 2x/week  2 weeks   Pertinent Vitals/Pain Afebrile, decreased    SLP Swallow Goals Patient will utilize recommended strategies during swallow to increase swallowing safety with: Total assistance   Swallow Study Prior Functional Status  Type of Home: Apartment Lives With: Other (Comment) (grandson) Available Help at Discharge: Family;Available 24 hours/day Vocation: Retired    Radio producer HPI: 78 yo female adm to [facility] with AMS, weakness, cough, fatigue.  Per admit note pt's cough is exacerbated by eating and drinking.  PMH + for kyphosis, HLD, ulcer right leg chronic, dementia, weakness-wheelchair bound, DVT, anemia.  Pt resides with daughter [name] and grandson [name].  [name] reports he works  but his mother is home 24/7 with pt.  Per grandson, pt's intake has been poor over the last month.  Per daughter [name] who arrived at end of sesion), pt with oral holding and coughing, especially recently.   Previous Swallow Assessment: none Diet Prior to this Study: Regular;Thin liquids Temperature Spikes Noted: No Respiratory Status:  Supplemental O2 delivered via (comment) (one liter) History of Recent Intubation: No Behavior/Cognition: Alert;Uncooperative;Doesn't follow directions;Decreased sustained attention;Impulsive (pt had dementia) Oral Cavity - Dentition:  (two teeth viewed on lower only, pt does not have dentures) Self-Feeding Abilities: Able to feed self;Needs assist Patient Positioning: Upright in chair Baseline Vocal Quality: Clear Volitional Cough: Weak Volitional Swallow: Unable to elicit    Oral/Motor/Sensory Function Overall Oral Motor/Sensory Function: Other (comment) (pt did not follow commands fully) Labial Strength:  (decr labial closure on cup) Lingual Strength:  (midline) Velum:  (bilateral)   Ice Chips Ice chips: Not tested   Thin Liquid Thin Liquid: Impaired Presentation: Cup Oral Phase Impairments: Impaired anterior to posterior transit;Reduced lingual movement/coordination Oral Phase Functional Implications: Left anterior spillage;Right anterior spillage;Prolonged oral transit Pharyngeal  Phase Impairments: Suspected delayed Swallow;Cough - Delayed Other Comments: cough x1 with milk when pt continuously consuming :  no s/s of asp when she consumed small single boluses     Nectar Thick Nectar Thick Liquid: Not tested   Honey Thick Honey Thick Liquid: Not tested   Puree Puree: Impaired Presentation: Self Fed;Spoon Oral Phase Impairments: Reduced lingual movement/coordination Oral Phase Functional Implications: Prolonged oral transit Pharyngeal Phase Impairments: Suspected delayed Swallow Other Comments: pt takes very small boluses   Solid   GO    Solid: Impaired Oral Phase Impairments: Reduced lingual movement/coordination;Impaired anterior to posterior transit Oral Phase Functional Implications: Left lateral sulci pocketing;Oral holding;Oral residue;Left anterior spillage Pharyngeal Phase Impairments: Suspected delayed Swallow Other Comments: bacon, scrambled eggs- pt unable to  masticate bacon:  eggs required applesauce and coffee to clear       [name] Burnet, MS [facility] SLP [phone]

## 2011-11-30 NOTE — Progress Notes (Signed)
Clinical Social Work Department BRIEF PSYCHOSOCIAL ASSESSMENT 11/30/2011  Patient:  [name],[name]     Account Number:  [id number]     Admit date:  11/29/2011  Clinical Social Worker:  Orpah Greek  Date/Time:  11/30/2011 02:15 PM  Referred by:  Physician  Date Referred:  11/30/2011 Referred for  SNF Placement   Other Referral:   Interview type:  Family  Other interview type:    PSYCHOSOCIAL DATA Living Status:  FAMILY Admitted from facility:   Level of care:   Primary support name:  Glorious Peach (grandaughter) c#: [phone] h#: [phone] Primary support relationship to patient:  FAMILY Degree of support available:   good    CURRENT CONCERNS Current Concerns  Post-Acute Placement   Other Concerns:    SOCIAL WORK ASSESSMENT / PLAN CSW spoke with patient's daughters Eather Colas (ph#: [phone]), Hilda Lias (ph#: [phone]), [name] (c#: [phone]) and grandaughter, Marylene Land (c#: [phone]) re: discharge planning. Grandaughter states that patient had been living with her daughter, [name] and grandson prior to hospitalization and feels that with family support, she does not require SNF stay.   Assessment/plan status:  Information/Referral to [facility] Other assessment/ plan:   Information/referral to community resources:   CSW completed FL2 should family change their mind about discharge and agree to patient being faxed out to facilities for bed offers.    PATIENT'S/FAMILY'S RESPONSE TO PLAN OF CARE: Patient's family are adamant about taking patient home, rather than SNF. CSW explained to family that it would be for short-term rehab, but daughters continued to say "my mother's not going to a nursing home". RNCM, Steward Drone made aware. CSW signing off.        Unice Bailey, LCSW [facility] Clinical Social Worker cell #: [phone]

## 2011-11-30 NOTE — Progress Notes (Signed)
[facility]  Patient Status: Active (receiving services up to time of hospitalization)  [facility] is providing the following services: RN and PT  If patient discharges after hours, please call [phone].   Please resume HH orders at d/c if needed.  Thank you  Norberta Keens, RN, BSN [phone] 11/30/2011, 10:34 AM

## 2011-11-30 NOTE — Evaluation (Signed)
Physical Therapy Evaluation Patient Details Name: [name] MRN: [id number] DOB: 09/28/1918 Today's Date: 11/30/2011 Time: 4098-1191 PT Time Calculation (min): 35 min  PT Assessment / Plan / Recommendation Clinical Impression  pt with AMS adm from home;  difficult to obtain accurate hx and prior functional status from grandson who "helps take care of her" however he did not even know how she takes her coffee (that she was requesting).  Will continue to follow; Recommend post acute rehab    PT Assessment  Patient needs continued PT services    Follow Up Recommendations  Post acute inpatient    Does the patient have the potential to tolerate intense rehabilitation   No, Recommend SNF  Barriers to Discharge        Equipment Recommendations  None recommended by PT    Recommendations for Other Services     Frequency Min 3X/week    Precautions / Restrictions Precautions Precautions: Fall Precaution Comments: ?issues with swallowing, pt spitting up white substance during PT/OT eval; RN notified   Pertinent Vitals/Pain       Mobility  Bed Mobility Bed Mobility: Supine to Sit Supine to Sit: 1: +2 Total assist Supine to Sit: Patient Percentage: 0% Transfers Transfers: Sit to Stand;Stand to Sit;Stand Pivot Transfers Sit to Stand: 1: +2 Total assist;From bed;From chair/3-in-1 Stand to Sit: 1: +2 Total assist;To chair/3-in-1 Stand to Sit: Patient Percentage: 0% Stand Pivot Transfers: 1: +2 Total assist (times 2) Stand Pivot Transfers: Patient Percentage: 0% Details for Transfer Assistance: assist for wt shift, LE support and trunk support Ambulation/Gait Ambulation/Gait Assistance: Not tested (comment) (unable, non-amb prior to adm????)    Shoulder Instructions     Exercises     PT Diagnosis: Generalized weakness  PT Problem List: Decreased strength;Decreased range of motion;Decreased activity tolerance;Decreased balance;Decreased mobility;Decreased knowledge of  use of DME PT Treatment Interventions: Functional mobility training;Therapeutic activities;Therapeutic exercise;Balance training;Patient/family education;Wheelchair mobility training   PT Goals Acute Rehab PT Goals PT Goal Formulation: With patient Time For Goal Achievement: 12/14/11 Potential to Achieve Goals: Fair Pt will Roll Supine to Right Side: with min assist PT Goal: Rolling Supine to Right Side - Progress: Goal set today Pt will Roll Supine to Left Side: with min assist PT Goal: Rolling Supine to Left Side - Progress: Goal set today Pt will go Supine/Side to Sit: with mod assist PT Goal: Supine/Side to Sit - Progress: Goal set today Pt will Sit at Edge of Bed: with supervision;3-5 min;with unilateral upper extremity support PT Goal: Sit at Edge Of Bed - Progress: Goal set today Pt will Transfer Bed to Chair/Chair to Bed: with mod assist PT Transfer Goal: Bed to Chair/Chair to Bed - Progress: Goal set today  Visit Information  Last PT Received On: 11/30/11 Assistance Needed: +2 PT/OT Co-Evaluation/Treatment: Yes    Subjective Data  Subjective: pt verbalizes minimally Patient Stated Goal: none stated   Prior Functioning  Home Living Lives With: Other (Comment) (grandson) Available Help at Discharge: Family;Available 24 hours/day Type of Home: Apartment Home Access: Stairs to enter Entrance Stairs-Number of Steps: 4 Entrance Stairs-Rails: None Home Layout: One level Home Adaptive Equipment: Hospital bed;Wheelchair - manual Additional Comments: grandson giving conflicting info regarding pt PLOF Prior Function Level of Independence: Needs assistance Needs Assistance: Bathing;Dressing;Grooming;Meal Prep;Toileting;Light Housekeeping Bath: Total Dressing: Total Grooming: Total Toileting: Total Meal Prep: Total Light Housekeeping: Total Driving: No Vocation: Retired Comments: Pt's grandson arrived at the end of session. Communication Communication: Expressive  difficulties Dominant Hand: Right  Cognition  Overall Cognitive Status: Impaired Area of Impairment: Memory;Safety/judgement;Awareness of errors;Awareness of deficits Arousal/Alertness: Awake/alert Orientation Level: Disoriented to;Place;Time;Situation Behavior During Session: WFL for tasks performed    Extremity/Trunk Assessment Right Lower Extremity Assessment RLE ROM/Strength/Tone: Deficits RLE ROM/Strength/Tone Deficits: grossly 2+ to 3/5; AAROM WFL except ankle--uanble to pf/df Left Lower Extremity Assessment LLE ROM/Strength/Tone: Deficits LLE ROM/Strength/Tone Deficits: grossly 2+ to 3/5; AAROM grossly WFL Trunk Assessment Trunk Assessment: Kyphotic   Balance Static Sitting Balance Static Sitting - Balance Support: Right upper extremity supported;Feet unsupported (pt unable to keep feet on floor) Static Sitting - Level of Assistance: 4: Min assist;3: Mod assist;5: Stand by assistance Static Sitting - Comment/# of Minutes: 5  End of Session PT - End of Session Equipment Utilized During Treatment: Gait belt Activity Tolerance: Patient tolerated treatment well Patient left: in chair;with call bell/phone within reach;with family/visitor present Nurse Communication: Mobility status;Need for lift equipment (maximove if desired)  GP     [facility] 11/30/2011, 10:30 AM

## 2011-11-30 NOTE — Progress Notes (Signed)
ANTICOAGULATION CONSULT NOTE  Pharmacy Consult for Warfarin Indication: atrial fibrillation & h/o DVT (July 2013)  No Known Allergies  Patient Measurements: Weight = 41.3 kg (11/1711) Height = 64 inches (11/24/11)   Vital Signs: Temp: 98.6 F (37 C) (10/23 1415) Temp src: Oral (10/23 1415) BP: 114/62 mmHg (10/23 1415) Pulse Rate: 70  (10/23 1415)  Labs:  Basename 11/30/11 1343 11/29/11 2214 11/29/11 1835 11/29/11 1355  HGB -- -- -- 13.8  HCT -- -- -- 42.2  PLT -- -- -- 198  APTT -- -- -- --  LABPROT 53.8* -- -- 45.7*  INR 6.73* -- -- 5.40*  HEPARINUNFRC -- -- -- --  CREATININE -- 1.30* 1.23* 1.23*  CKTOTAL -- -- -- --  CKMB -- -- -- --  TROPONINI -- -- -- --    Estimated Creatinine Clearance: 19.6 ml/min (by C-G formula based on Cr of 1.3).  Medications:  Scheduled:     . cefTRIAXone (ROCEPHIN)  IV  1 g Intravenous Daily  . feeding supplement  237 mL Oral BID BM  . hydrocerin   Topical Daily  . influenza  inactive virus vaccine  0.5 mL Intramuscular Tomorrow-1000  . metoprolol tartrate  12.5 mg Oral BID  . phytonadione  5 mg Oral Once  . sodium chloride  3 mL Intravenous Q12H  . Warfarin - Pharmacist Dosing Inpatient   Does not apply q1800  . DISCONTD: feeding supplement  1 Container Oral BID BM  . DISCONTD: lisinopril  5 mg Oral Daily   Infusions:     . dextrose 5 % and 0.45% NaCl 50 mL/hr at 11/30/11 0857  . DISCONTD: sodium chloride      Assessment:  78 year old female on warfarin 1 mg daily PTA for atrial fibrillation and a DVT (July 2013). Pt with permanent pacemaker  Last dose of warfarin 1mg  noted as taken on 11/28/11  INR upon admission = 5.4, increased to 6.73 today  MD ordered vitamin K 5mg  po tonight  Goal of Therapy:  INR goal per [facility] records = 2.5-3.5   Plan:   No warfarin tonight  Check daily PT/INR  Loralee Pacas, PharmD, BCPS Pager: [phone] 11/30/2011,6:04 PM

## 2011-12-01 DIAGNOSIS — E870 Hyperosmolality and hypernatremia: Principal | ICD-10-CM

## 2011-12-01 DIAGNOSIS — I4891 Unspecified atrial fibrillation: Secondary | ICD-10-CM

## 2011-12-01 DIAGNOSIS — E440 Moderate protein-calorie malnutrition: Secondary | ICD-10-CM

## 2011-12-01 LAB — BASIC METABOLIC PANEL
BUN: 25 mg/dL — ABNORMAL HIGH (ref 6–23)
Chloride: 111 mEq/L (ref 96–112)
GFR calc Af Amer: 64 mL/min — ABNORMAL LOW (ref >90)
GFR calc non Af Amer: 55 mL/min — ABNORMAL LOW (ref >90)
Potassium: 3.6 mEq/L (ref 3.5–5.1)
Sodium: 147 mEq/L — ABNORMAL HIGH (ref 135–145)

## 2011-12-01 LAB — PROTIME-INR: Prothrombin Time: 36.8 seconds — ABNORMAL HIGH (ref 11.6–15.2)

## 2011-12-01 LAB — GLUCOSE, CAPILLARY: Glucose-Capillary: 87 mg/dL (ref 70–99)

## 2011-12-01 MED ORDER — MIRTAZAPINE 7.5 MG PO TABS
7.5000 mg | ORAL_TABLET | Freq: Every day | ORAL | Status: DC
  Administered 2011-12-01: 7.5 mg via ORAL
  Filled 2011-12-01 (×2): qty 1

## 2011-12-01 MED ORDER — PHYTONADIONE 5 MG PO TABS
5.0000 mg | ORAL_TABLET | Freq: Once | ORAL | Status: AC
  Administered 2011-12-01: 5 mg via ORAL
  Filled 2011-12-01: qty 1

## 2011-12-01 NOTE — Progress Notes (Signed)
Speech Language Pathology Dysphagia Treatment Patient Details Name: [name] MRN: [id number] DOB: 11-Jan-1919 Today's Date: 12/01/2011 Time: 1191-4782 SLP Time Calculation (min): 42 min  Assessment / Plan / Recommendation Clinical Impression  Pt seen to assess tolerance of diet and educate family re: dysphagia and dementia, compensatory strategies, etc.   Pt's daughter [name] present who reports pt tolerated breakfast well but did not eat much.  Intake had been poor prior to admit- likely due to pt's dementia and dysphagia.   With much encouragement, pt ate a single bite of pureed greenbeans and [place] and icecream.  Delayed swallow continues but no clinical indications of aspiration:  Rec continue puree/thin diet with strict precautions.  [name] re=educated to assure pt swallows before taking more, to check for oral stasis and to diet modifications recommended.  [name] given list of dysphagia diet to modify pt's diet if she will not eat pureed foods but also informed of incr pulm risk if aspirates solids.  Daughter states pt wanted to eat the magic cup - ? progression of dementia impacting her taste - advised taste for sweet is last intact so may be helpful to add sugar to vegies/meats/etc.    SLP to follow for dysphagia management.      Diet Recommendation  Initiate / Change Diet: Dysphagia 1 (puree);Thin liquid    SLP Plan Continue with current plan of care   Pertinent Vitals/Pain Afebrile, 25% intake   Swallowing Goals  SLP Swallowing Goals Swallow Study Goal #2 - Progress: Progressing toward goal  General Temperature Spikes Noted: No Respiratory Status: Supplemental O2 delivered via (comment) Behavior/Cognition: Alert;Doesn't follow directions;Decreased sustained attention;Uncooperative;Distractible Patient Positioning: Postural control interferes with function (pt severely kyphotic and is unable to hold head up fully )  Oral Cavity - Oral Hygiene   educated family to  importance of oral care  Dysphagia Treatment Treatment focused on: Patient/family/caregiver education;Skilled observation of diet tolerance Family/Caregiver Educated: daughter [name] Treatment Methods/Modalities: Skilled observation Feeding: Needs assist (able to self feed via spoon but needs encouragement) Liquids provided via: Cup Pharyngeal Phase Signs & Symptoms: Suspected delayed swallow initiation Type of cueing: Verbal Amount of cueing: Moderate   GO     Donavan Burnet, MS [facility] SLP [phone]

## 2011-12-01 NOTE — ED Provider Notes (Signed)
Medical screening examination/treatment/procedure(s) were conducted as a shared visit with non-physician practitioner(s) and myself.  I personally evaluated the patient during the encounter.  AMS, hypernatremia,  Elevated INR.  admit  Donnetta Hutching, MD 12/01/11 [phone]

## 2011-12-01 NOTE — Progress Notes (Signed)
[facility] PROGRESS NOTE  [place] JXB:[id number] DOB: 1918-11-19 DOA: 11/29/2011 PCP: Rogelia Boga, MD  Assessment/Plan: *Altered mental status  -likely multifactorial secondary to dehydration, hypernatremia, poor oral intake, progressive failure to thrive and deconditioning  - continue IVF with D5 1/2 NS. Hypernatremia slowly improving  - follow labs.  -swallow eval recommends dysphagia level 1 diet. Appreciate nutrition recommendations. Will add Remeron for appetite stimulation -UA suggests UTI. Check urine cx. Started on IV rocephin   Dementia with poor mobility  - progressive and with failure to thrive  - PT evaluation recommends SNF but daughter wanted to take her home as  -discussed with daughter regarding goals of care. Wants to continue medial care for now. She wishes her to be DNR and feels this is what the patient had wanted. - Atrial fibrillation  - rate controlled . On coumadin with supratheraperutic INR. On hold  And given vitamin k . still > 4 today.  -resume BB   Hypernatremia  - secondary to dehydration and poor oral intake . Slowly improving - cont IV fluids as above. Recheck labs in am   Acute on chronic renal failure  - secondary to pre renal etiology and dehydration . Also on lasix at home  - will continue IVF as noted above . Now improved   Diastolic dysfn  On lasix at home. Appears dehydrated. Hold lasix and continue gentle hydration   Normocytic anemia  - stable    Malnutrition ( mild to moderate)  Secondary to poor po intake  appreciate nutrition recs       Code Status: DNR Family Communication: daughter at bedisde Disposition Plan: home with services   Consultants:  none  Procedures:  none  Antibiotics:  IV rocephin day 2   HPI/Subjective: Appears more oriented today and eating. Denies any symptoms  Objective: Filed Vitals:   11/30/11 2154 11/30/11 2244 12/01/11 0500 12/01/11 1045  BP:  112/66  147/74 110/70  Pulse: 71 70 70 70  Temp:  98.6 F (37 C) 97.5 F (36.4 C)   TempSrc:  Oral Oral   Resp:  18 18   Height:      Weight:      SpO2:  100% 100%     Intake/Output Summary (Last 24 hours) at 12/01/11 1145 Last data filed at 12/01/11 0747  Gross per 24 hour  Intake 1359.17 ml  Output      0 ml  Net 1359.17 ml   Filed Weights   11/29/11 1734  Weight: 43.999 kg (97 lb)    Exam:  General: appears frail, in NAD  HEENT: no pallor, dry oral mucosa  Cardiovascular: * N S1& S2, no murmurs  Respiratory: clear b/l no added sounds  Abdomen: soft, NT ND BS+  Ext: warm, no edema  CNS; AAOX 2, non focal  Data Reviewed: Basic Metabolic Panel:  Lab 12/01/11 1308 11/29/11 2214 11/29/11 1835 11/29/11 1355 11/25/11 0035  NA 147* 149* 150* 151* 145  K 3.6 4.2 3.7 3.8 3.0*  CL 111 113* 115* 115* 108  CO2 27 28 25 23 27   GLUCOSE 90 85 83 87 118*  BUN 25* 35* 35* 37* 35*  CREATININE 0.89 1.30* 1.23* 1.23* 0.93  CALCIUM 8.8 9.1 9.1 9.4 9.4  MG -- -- 2.3 -- --  PHOS -- -- 3.8 -- --   Liver Function Tests:  Lab 11/25/11 0035  AST 18  ALT 13  ALKPHOS 50  BILITOT 0.4  PROT 6.3  ALBUMIN 3.0*  Lab 11/25/11 0035  LIPASE 38  AMYLASE --   No results found for this basename: AMMONIA:5 in the last 168 hours CBC:  Lab 11/29/11 1355 11/25/11 0035  WBC 8.6 6.9  NEUTROABS -- 5.2  HGB 13.8 13.5  HCT 42.2 40.4  MCV 85.6 84.5  PLT 198 214   Cardiac Enzymes: No results found for this basename: CKTOTAL:5,CKMB:5,CKMBINDEX:5,TROPONINI:5 in the last 168 hours BNP (last 3 results)  Basename 10/12/11 0432 10/11/11 0825 10/10/11 2020  PROBNP 19881.0* 15820.0* 9771.0*   CBG:  Lab 12/01/11 0729 11/30/11 0807  GLUCAP 87 66*    Recent Results (from the past 240 hour(s))  URINE CULTURE     Status: Normal   Collection Time   11/25/11  2:32 AM      Component Value Range Status Comment   Specimen Description URINE, CATHETERIZED   Final    Special Requests NONE   Final      Culture  Setup Time 11/25/2011 09:21   Final    Colony Count >=100,000 COLONIES/ML   Final    Culture PROTEUS MIRABILIS   Final    Report Status 11/28/2011 FINAL   Final    Organism ID, Bacteria PROTEUS MIRABILIS   Final      Studies: Ct Head Wo Contrast  11/29/2011  *RADIOLOGY REPORT*  Clinical Data: Decreased LOC  CT HEAD WITHOUT CONTRAST  Technique:  Contiguous axial images were obtained from the base of the skull through the vertex without contrast.  Comparison: None.  Findings: No skull fracture is noted.  There is mucosal thickening and opacification of the left sphenoid sinus.  Atherosclerotic calcifications of carotid siphon are noted.  No intracranial hemorrhage, mass effect or midline shift.  There is moderate cerebral and cerebellar atrophy.  No acute infarction.  No mass lesion is noted on this unenhanced scan.  Periventricular and patchy subcortical white matter decreased attenuation probable due to chronic small vessel ischemic changes.  IMPRESSION: No acute intracranial abnormality.  Moderate cerebral atrophy. Periventricular and subcortical white matter decreased attenuation probable due to chronic small vessel ischemic changes. Atherosclerotic calcifications of carotid siphon are noted. Mucosal thickening and opacification left sphenoid sinus.   Original Report Authenticated By: Natasha Mead, M.D.     Scheduled Meds:   . cefTRIAXone (ROCEPHIN)  IV  1 g Intravenous Daily  . feeding supplement  237 mL Oral BID BM  . hydrocerin   Topical Daily  . metoprolol tartrate  12.5 mg Oral BID  . phytonadione  5 mg Oral Once  . phytonadione  5 mg Oral Once  . sodium chloride  3 mL Intravenous Q12H  . Warfarin - Pharmacist Dosing Inpatient   Does not apply q1800  . DISCONTD: feeding supplement  1 Container Oral BID BM   Continuous Infusions:   . dextrose 5 % and 0.45% NaCl 50 mL/hr at 12/01/11 0303       Time spent: 25 MINUTES    [name]  [facility] Pager  [phone]. If 8PM-8AM, please contact night-coverage at [email or web address], password [facility] 12/01/2011, 11:45 AM  LOS: 2 days

## 2011-12-01 NOTE — Progress Notes (Signed)
ANTICOAGULATION CONSULT NOTE  Pharmacy Consult for Warfarin Indication: atrial fibrillation & h/o DVT (July 2013)  No Known Allergies  Patient Measurements: Weight = 41.3 kg (11/1711) Height = 64 inches (11/24/11)   Vital Signs: Temp: 97.5 F (36.4 C) (10/24 0500) Temp src: Oral (10/24 0500) BP: 110/70 mmHg (10/24 1045) Pulse Rate: 70  (10/24 1045)  Labs:  Basename 12/01/11 0445 11/30/11 1343 11/29/11 2214 11/29/11 1835 11/29/11 1355  HGB -- -- -- -- 13.8  HCT -- -- -- -- 42.2  PLT -- -- -- -- 198  APTT -- -- -- -- --  LABPROT 36.8* 53.8* -- -- 45.7*  INR 4.03* 6.73* -- -- 5.40*  HEPARINUNFRC -- -- -- -- --  CREATININE 0.89 -- 1.30* 1.23* --  CKTOTAL -- -- -- -- --  CKMB -- -- -- -- --  TROPONINI -- -- -- -- --    Estimated Creatinine Clearance: 28.6 ml/min (by C-G formula based on Cr of 0.89).  Medications:  Scheduled:     . cefTRIAXone (ROCEPHIN)  IV  1 g Intravenous Daily  . feeding supplement  237 mL Oral BID BM  . hydrocerin   Topical Daily  . metoprolol tartrate  12.5 mg Oral BID  . phytonadione  5 mg Oral Once  . phytonadione  5 mg Oral Once  . sodium chloride  3 mL Intravenous Q12H  . Warfarin - Pharmacist Dosing Inpatient   Does not apply q1800  . DISCONTD: feeding supplement  1 Container Oral BID BM   Infusions:     . dextrose 5 % and 0.45% NaCl 50 mL/hr at 12/01/11 0303    Assessment:  78 year old female admitted with AMS and FTT on warfarin 1 mg daily PTA for atrial fibrillation and a DVT (July 2013). Pt with permanent pacemaker.  Last dose of warfarin 1mg  noted as taken on 11/28/11.  INR upon admission = 5.4, increased to 6.73 10/23.  MD ordered vitamin K 5mg  po on 10/23.  INR 4.03 today.  Goal of Therapy:  INR goal per [place] records = 2.5-3.5   Plan:   No warfarin tonight.  Check daily PT/INR.  CBC in am.  Charolotte Eke, PharmD, pager [phone]. 12/01/2011,12:55 PM.

## 2011-12-01 NOTE — Progress Notes (Signed)
Advised by MD today that  A family member is now agreeable to SNF.  Contacted Marylene Land Scales (granddaughter and contact person per Unice Bailey, LCSWA, who is not agreeable to this plan- she states she is HCPOA but I am certain this is documented on file here. Later, rec'd call from Ardelle Anton who states she is the oldest living child (14 living children) and also is not agreeable to SNF- she states there is not a HCPOA- MD contacted and asked to f/u with Marylene Land.  Reece Levy, MSW, Theresia Majors [phone]

## 2011-12-02 DIAGNOSIS — R791 Abnormal coagulation profile: Secondary | ICD-10-CM | POA: Diagnosis present

## 2011-12-02 LAB — PROTIME-INR
INR: 1.48 (ref 0.00–1.49)
Prothrombin Time: 17.5 seconds — ABNORMAL HIGH (ref 11.6–15.2)

## 2011-12-02 LAB — CBC
HCT: 39 % (ref 36.0–46.0)
Hemoglobin: 12.6 g/dL (ref 12.0–15.0)
MCHC: 32.3 g/dL (ref 30.0–36.0)
RBC: 4.46 MIL/uL (ref 3.87–5.11)
WBC: 6.4 10*3/uL (ref 4.0–10.5)

## 2011-12-02 LAB — BASIC METABOLIC PANEL
Chloride: 106 mEq/L (ref 96–112)
GFR calc Af Amer: 83 mL/min — ABNORMAL LOW (ref >90)
GFR calc non Af Amer: 72 mL/min — ABNORMAL LOW (ref >90)
Potassium: 4.1 mEq/L (ref 3.5–5.1)
Sodium: 140 mEq/L (ref 135–145)

## 2011-12-02 MED ORDER — WARFARIN SODIUM 1 MG PO TABS
1.5000 mg | ORAL_TABLET | Freq: Once | ORAL | Status: AC
  Administered 2011-12-02: 1.5 mg via ORAL
  Filled 2011-12-02: qty 1

## 2011-12-02 MED ORDER — WARFARIN SODIUM 1 MG PO TABS
1.0000 mg | ORAL_TABLET | Freq: Once | ORAL | Status: DC
  Filled 2011-12-02: qty 1

## 2011-12-02 MED ORDER — MIRTAZAPINE 7.5 MG PO TABS: 7.5000 mg | ORAL_TABLET | Freq: Every day | ORAL | Status: AC

## 2011-12-02 MED ORDER — WARFARIN SODIUM 1 MG PO TABS: 1.5000 mg | ORAL_TABLET | Freq: Every day | ORAL | Status: DC

## 2011-12-02 NOTE — Progress Notes (Signed)
ANTICOAGULATION CONSULT NOTE  Pharmacy Consult for Warfarin Indication: atrial fibrillation & h/o DVT (July 2013)  No Known Allergies  Patient Measurements: Weight = 41.3 kg (11/1711) Height = 64 inches (11/24/11)   Vital Signs: Temp: 97.4 F (36.3 C) (10/25 0649) Temp src: Axillary (10/25 0649) BP: 113/86 mmHg (10/25 0649) Pulse Rate: 78  (10/25 0649)  Labs:  Basename 12/02/11 0915 12/02/11 0600 12/01/11 0445 11/30/11 1343 11/29/11 2214 11/29/11 1355  HGB -- 12.6 -- -- -- 13.8  HCT -- 39.0 -- -- -- 42.2  PLT -- PLATELET CLUMPS NOTED ON SMEAR, COUNT APPEARS ADEQUATE -- -- -- 198  APTT -- -- -- -- -- --  LABPROT 17.5* -- 36.8* 53.8* -- --  INR 1.48 -- 4.03* 6.73* -- --  HEPARINUNFRC -- -- -- -- -- --  CREATININE -- 0.76 0.89 -- 1.30* --  CKTOTAL -- -- -- -- -- --  CKMB -- -- -- -- -- --  TROPONINI -- -- -- -- -- --    Estimated Creatinine Clearance: 31.8 ml/min (by C-G formula based on Cr of 0.76).  Medications:  Scheduled:     . cefTRIAXone (ROCEPHIN)  IV  1 g Intravenous Daily  . feeding supplement  237 mL Oral BID BM  . hydrocerin   Topical Daily  . metoprolol tartrate  12.5 mg Oral BID  . mirtazapine  7.5 mg Oral QHS  . sodium chloride  3 mL Intravenous Q12H  . Warfarin - Pharmacist Dosing Inpatient   Does not apply q1800   Infusions:     . dextrose 5 % and 0.45% NaCl 50 mL/hr at 12/01/11 2357    Assessment:  78 year old female admitted with AMS and FTT on warfarin 1 mg daily PTA for atrial fibrillation and a DVT (July 2013). Pt with permanent pacemaker.  INR supratherapeutic on admission.  Increased to 6.73 on 10/23 so Vitamin K 5mg  po was given.  INR down significantly to 1.48 this am. Spoke with Dr. Gonzella Lex about bridging with Lovenox while INR subtherapeutic. He wants to hold off for now.  CBC ok, platelets clumped.  Eating some. Tolerated Ensure x 1 yesterday.  Rocephin shouldn't increase INR sensitivity much.  Goal of Therapy:  INR goal  per [place] records = 2.5-3.5   Plan:   Give warfarin 1.5mg  today at 1200.  Check daily PT/INR.  Charolotte Eke, PharmD, pager [phone]. 12/02/2011,9:57 AM.

## 2011-12-02 NOTE — Progress Notes (Signed)
EMS transport arranged per family request as pt is total assist+2. Reece Levy, MSW, Theresia Majors [phone]

## 2011-12-02 NOTE — Discharge Summary (Addendum)
Physician Discharge Summary  [facility] ZOX:[id number] DOB: [birth year] DOA: 11/29/2011  PCP: Caesar Bookman, NP  Admit date: 11/29/2011 Discharge date: 12/02/2011  Time spent: 40 minutes  Recommendations for Outpatient Follow-up:  1. Home with out patient follow up  Discharge Diagnoses:  Principal Problem:  *Altered mental status  Active Problems:  HYPERTENSION  Dementia  Normocytic anemia  Decreased mobility  Moderate malnutrition  Atrial fibrillation  Hypernatremia  Acute on chronic renal failure supratherapeutic INR   Discharge Condition: fair  Diet recommendation: DYSPHAGIA LEVEL 1   Filed Weights   11/29/11 1734  Weight: 43.999 kg (97 lb)    History of present illness:  Please review admission H&P for details but in brief 78 y/o female with hx of diastolic dysfn, afib, dementia with poor mobility , recurrent UTIs presented with AMS in the setting of dehydration with hypernatremia and supra therapeutic INR.   Hospital Course:  Altered mental status  -likely due to metabolic encephalopathy secondary to dehydration, hypernatremia, has poor oral intake, progressive failure to thrive and deconditioning as well. - given IVF with D5 1/2 NS. Hypernatremia now resolved  -swallow eval recommends dysphagia level 1 diet. Appreciate nutrition recommendations. Added low dose  Remeron for appetite stimulation  -UA suggests UTI. Appears she has recurrent UTI and recently treated in ED with a course fo keflex. -her mental status now improved to baseline.  Dementia with poor mobility  - progressive and with failure to thrive  - PT evaluation recommends SNF but family  wanted to take her home     supratherapeutic INR Possibly in the setting of inadequate  and deconditioning. Patient's INR is being checked once a month as outpatient.   patient on coumadin for RUE DVT since July 2013. Her INR was > 6 on presentation requiring vitamin k. INR this am of 1.48. i will  discharge her on a dose fo 1.5 mg daily ( ws on 1 mg at home). i have spoken with her PCP adn will have he INR checked and followed on 10/28.  Atrial fibrillation  - rate controlled . Continue metoprolol    Acute on chronic renal failure  - secondary to pre renal etiology and dehydration . Also on lasix at home at some time --now resolved. Low dose lisinopril can be resumed on discharge. Needs labs checked in 1 week  Diastolic dysfn  ?On lasix at home which i have discontinued. Recheck  Labs in 1 week and address need for lasix as outpatient.  Follows with [facility]  Normocytic anemia  - stable   Malnutrition ( mild to moderate)  Secondary to poor po intake  appreciate nutrition recs    Patient followed by NP Carilyn Goodpasture Ngetich who works with Dr Redmond School for home visits. i have updated the hospital course and plan . She needs more frequent INR checks as outpatient. She will be followed on 10/28 and her labs checked    Code Status: DNR  Family Communication: daughter at bedisde  Disposition Plan: home with services   Procedures:  none  Consultations:  none  Discharge Exam: Filed Vitals:   12/01/11 2327 12/01/11 2346 12/02/11 0649 12/02/11 1003  BP: 118/62  113/86 109/66  Pulse: 70 72 78 70  Temp: 97.6 F (36.4 C)  97.4 F (36.3 C)   TempSrc: Oral  Axillary   Resp: 16  22   Height:      Weight:      SpO2: 98%  98%     General:  appears frail, in NAD  HEENT: no pallor, dry oral mucosa  Cardiovascular: * N S1& S2, no murmurs  Respiratory: clear b/l no added sounds  Abdomen: soft, NT ND BS+  Ext: warm, no edema CNS;  AAOX 2, non focal   Discharge Instructions     Medication List     As of 12/02/2011 11:36 AM    STOP taking these medications         cephALEXin 500 MG capsule   Commonly known as: KEFLEX      TAKE these medications         acetaminophen 650 MG CR tablet   Commonly known as: TYLENOL   Take 325 mg by mouth every 6 (six) hours as  needed. For pain      feeding supplement Pudg   Take 1 Container by mouth 2 (two) times daily between meals.      lisinopril 5 MG tablet   Commonly known as: PRINIVIL,ZESTRIL   Take 5 mg by mouth daily.      metoprolol tartrate 12.5 mg Tabs   Commonly known as: LOPRESSOR   Take 12.5 mg by mouth 2 (two) times daily.      mirtazapine 7.5 MG tablet   Commonly known as: REMERON   Take 1 tablet (7.5 mg total) by mouth at bedtime.      warfarin 1 MG tablet   Commonly known as: COUMADIN   Take 1.5 tablets (1.5 mg total) by mouth daily.           Follow-up Information    Follow up with Ngetich, Dinah C, NP. In 1 week. (patient will have her INR checked on 10/28)    Contact information:   [address] [place] [address] [phone]           The results of significant diagnostics from this hospitalization (including imaging, microbiology, ancillary and laboratory) are listed below for reference.    Significant Diagnostic Studies: Ct Head Wo Contrast  11/29/2011  *RADIOLOGY REPORT*  Clinical Data: Decreased LOC  CT HEAD WITHOUT CONTRAST  Technique:  Contiguous axial images were obtained from the base of the skull through the vertex without contrast.  Comparison: None.  Findings: No skull fracture is noted.  There is mucosal thickening and opacification of the left sphenoid sinus.  Atherosclerotic calcifications of carotid siphon are noted.  No intracranial hemorrhage, mass effect or midline shift.  There is moderate cerebral and cerebellar atrophy.  No acute infarction.  No mass lesion is noted on this unenhanced scan.  Periventricular and patchy subcortical white matter decreased attenuation probable due to chronic small vessel ischemic changes.  IMPRESSION: No acute intracranial abnormality.  Moderate cerebral atrophy. Periventricular and subcortical white matter decreased attenuation probable due to chronic small vessel ischemic changes. Atherosclerotic  calcifications of carotid siphon are noted. Mucosal thickening and opacification left sphenoid sinus.   Original Report Authenticated By: Natasha Mead, M.D.    Dg Abd Acute W/chest  11/24/2011  *RADIOLOGY REPORT*  Clinical Data: Mid abdominal pain for several days.  ACUTE ABDOMEN SERIES (ABDOMEN 2 VIEW & CHEST 1 VIEW)  Comparison: 06/26/2010  Findings: Cardiac enlargement with normal pulmonary vascularity. Lucency and scattered interstitial changes in the lungs suggesting emphysema and scattered fibrosis.  No focal airspace consolidation. No blunting of costophrenic angles.  Calcified and tortuous aorta. Cardiac pacemaker.  Prominent stool in the rectum with scattered gas and stool throughout the colon.  Gas within nondistended small bowel loops. No small  or large bowel distension.  No free intra-abdominal air. No abnormal air fluid levels.  Vascular calcifications.  Calcified phleboliths in the pelvis.  No radiopaque stones.  Degenerative changes in the lumbar spine and hips.  The no significant changes since previous study.  IMPRESSION: Emphysema and scattered fibrosis in the lungs.  No evidence of active consolidation.  Nonobstructive bowel gas pattern.   Original Report Authenticated By: Marlon Pel, M.D.     Microbiology: Recent Results (from the past 240 hour(s))  URINE CULTURE     Status: Normal   Collection Time   11/25/11  2:32 AM      Component Value Range Status Comment   Specimen Description URINE, CATHETERIZED   Final    Special Requests NONE   Final    Culture  Setup Time 11/25/2011 09:21   Final    Colony Count >=100,000 COLONIES/ML   Final    Culture PROTEUS MIRABILIS   Final    Report Status 11/28/2011 FINAL   Final    Organism ID, Bacteria PROTEUS MIRABILIS   Final      Labs: Basic Metabolic Panel:  Lab 12/02/11 1610 12/01/11 0445 11/29/11 2214 11/29/11 1835 11/29/11 1355  NA 140 147* 149* 150* 151*  K 4.1 3.6 4.2 3.7 3.8  CL 106 111 113* 115* 115*  CO2 28 27 28 25  23   GLUCOSE 82 90 85 83 87  BUN 19 25* 35* 35* 37*  CREATININE 0.76 0.89 1.30* 1.23* 1.23*  CALCIUM 8.6 8.8 9.1 9.1 9.4  MG -- -- -- 2.3 --  PHOS -- -- -- 3.8 --   Liver Function Tests: No results found for this basename: AST:5,ALT:5,ALKPHOS:5,BILITOT:5,PROT:5,ALBUMIN:5 in the last 168 hours No results found for this basename: LIPASE:5,AMYLASE:5 in the last 168 hours No results found for this basename: AMMONIA:5 in the last 168 hours CBC:  Lab 12/02/11 0600 11/29/11 1355  WBC 6.4 8.6  NEUTROABS -- --  HGB 12.6 13.8  HCT 39.0 42.2  MCV 87.4 85.6  PLT PLATELET CLUMPS NOTED ON SMEAR, COUNT APPEARS ADEQUATE 198   Cardiac Enzymes: No results found for this basename: CKTOTAL:5,CKMB:5,CKMBINDEX:5,TROPONINI:5 in the last 168 hours BNP: BNP (last 3 results)  Basename 10/12/11 0432 10/11/11 0825 10/10/11 2020  PROBNP 19881.0* 15820.0* 9771.0*   CBG:  Lab 12/01/11 0729 11/30/11 0807  GLUCAP 87 66*       Signed:  Yaeko Fazekas  [facility] 12/02/2011, 11:36 AM

## 2011-12-02 NOTE — Progress Notes (Signed)
Physical Therapy Treatment Patient Details Name: [place] MRN: [id number] DOB: 1918-08-04 Today's Date: 12/02/2011 Time: 4098-1191 PT Time Calculation (min): 24 min  PT Assessment / Plan / Recommendation Comments on Treatment Session  2 family members present during session.  Pt progressing poorly and requires + 2 total assist to get OOB.  Assisted pt to BSC(+BM) then to recliner.  Family states they are taking her home and understand she will need 24/7 care.  They have a hospital bed and all equipment.    Follow Up Recommendations   (LPT rec SNF however family states they are taking her home via ambulance     Does the patient have the potential to tolerate intense rehabilitation     Barriers to Discharge        Equipment Recommendations  None recommended by PT    Recommendations for Other Services    Frequency Min 3X/week   Plan Frequency remains appropriate    Precautions / Restrictions Precautions Precautions: Fall Precaution Comments: fearful, AMS Restrictions Weight Bearing Restrictions: No    Pertinent Vitals/Pain No c/o pain    Mobility  Bed Mobility Bed Mobility: Supine to Sit Supine to Sit: 1: +2 Total assist Supine to Sit: Patient Percentage: 0% Details for Bed Mobility Assistance: HOB elevated and increased time to pracess one step repeated commands.  Transfers Transfers: Editor, commissioning Transfers: 1: +2 Total assist Stand Pivot Transfers: Patient Percentage: 0% Details for Transfer Assistance: extensor tone with hip thrusts while EOB.  Unable to get pt to sit erect.  Severe posterior lean and death grip to bed. Stand pivot "Bear Hug" 1/4 turn from bed to [facility] then to recliner.  Hoyer pad in chair for nursing to use to assist pt back to bed.  Ambulation/Gait Ambulation/Gait Assistance Details: Pt non amb at this time     PT Goals                     Progressing slowly    Visit Information  Last PT Received On:  12/02/11 Assistance Needed: +2    Subjective Data      Cognition    oriented to self   Balance   zero  End of Session PT - End of Session Equipment Utilized During Treatment: Gait belt Activity Tolerance: Treatment limited secondary to medical complications (Comment) (AMS/fear)   Felecia Shelling  PTA [facility]  Acute  Rehab Pager     [phone]

## 2011-12-02 NOTE — Progress Notes (Signed)
Patient discharged home via ambulance. Discharge instructions given and explained to family/caregiver and they verbalized understand. No signs of pain/distress noted. Stage II on scrum patient was admitted with, cleaned and applied allyven, encourage family to continue Q2hrs turn and F/U with healing, No signs of infection noted. Family at the bedside during transportation.

## 2011-12-03 NOTE — Care Management Note (Signed)
    Page 1 of 1   12/03/2011     8:31:10 PM   CARE MANAGEMENT NOTE 12/03/2011  Patient:  [name]   Account Number:  [id number]  Date Initiated:  11/30/2011  Documentation initiated by:  Jiles Crocker  Subjective/Objective Assessment:   ADMITTED WITH WEAKNESS     Action/Plan:   PCP: Rogelia Boga, MD  LIVES WITH DAUGHTER, HAS [facility] FOR [facility] NEEDS   Anticipated DC Date:  12/07/2011   Anticipated DC Plan:  HOME W HOME HEALTH SERVICES  In-house referral  Clinical Social Worker      DC Planning Services  CM consult      Choice offered to / List presented to:  C-1 Patient        HH arranged  HH-1 Theatre stage manager PT      [facility] agency  [facility]   Status of service:  Completed, signed off Medicare Important Message given?  NA - LOS <3 / Initial given by admissions (If response is "NO", the following Medicare IM given date fields will be blank) Date Medicare IM given:   Date Additional Medicare IM given:    Discharge Disposition:  HOME W HOME HEALTH SERVICES  Per UR Regulation:  Reviewed for med. necessity/level of care/duration of stay  If discussed at Long Length of Stay Meetings, dates discussed:    Comments:  12/03/11 Sherod Cisse RN,BSN NCM [phone] FAXED TO [facility] FAX#[phone],D/C SUMMARY,HH ORDERS W/CONFIRMATION.  11/30/2011- B CHANDLER RN, BSN, MHA

## 2011-12-14 ENCOUNTER — Encounter: Payer: Self-pay | Admitting: Cardiology

## 2012-01-26 ENCOUNTER — Telehealth: Payer: Self-pay | Admitting: Internal Medicine

## 2012-01-26 NOTE — Telephone Encounter (Signed)
01-26-12 talked with pt's dtr re pacer ck's, had checked in hospital in September, dtr will talk with her niece and sister who has transportation and call back to set up pacer ck/mt

## 2012-02-01 ENCOUNTER — Encounter (HOSPITAL_COMMUNITY): Payer: Self-pay | Admitting: Emergency Medicine

## 2012-02-01 ENCOUNTER — Emergency Department (HOSPITAL_COMMUNITY): Payer: Medicare Other

## 2012-02-01 ENCOUNTER — Emergency Department (HOSPITAL_COMMUNITY)
Admission: EM | Admit: 2012-02-01 | Discharge: 2012-02-01 | Disposition: A | Discharge status: Home / Self Care | Payer: Medicare Other | Attending: Emergency Medicine | Admitting: Emergency Medicine

## 2012-02-01 DIAGNOSIS — Z8659 Personal history of other mental and behavioral disorders: Secondary | ICD-10-CM | POA: Insufficient documentation

## 2012-02-01 DIAGNOSIS — Z862 Personal history of diseases of the blood and blood-forming organs and certain disorders involving the immune mechanism: Secondary | ICD-10-CM | POA: Insufficient documentation

## 2012-02-01 DIAGNOSIS — Z8739 Personal history of other diseases of the musculoskeletal system and connective tissue: Secondary | ICD-10-CM | POA: Insufficient documentation

## 2012-02-01 DIAGNOSIS — Z8679 Personal history of other diseases of the circulatory system: Secondary | ICD-10-CM | POA: Insufficient documentation

## 2012-02-01 DIAGNOSIS — Z79899 Other long term (current) drug therapy: Secondary | ICD-10-CM | POA: Insufficient documentation

## 2012-02-01 DIAGNOSIS — K5900 Constipation, unspecified: Secondary | ICD-10-CM

## 2012-02-01 DIAGNOSIS — E785 Hyperlipidemia, unspecified: Secondary | ICD-10-CM | POA: Insufficient documentation

## 2012-02-01 DIAGNOSIS — Z872 Personal history of diseases of the skin and subcutaneous tissue: Secondary | ICD-10-CM | POA: Insufficient documentation

## 2012-02-01 DIAGNOSIS — I10 Essential (primary) hypertension: Secondary | ICD-10-CM | POA: Insufficient documentation

## 2012-02-01 DIAGNOSIS — Z993 Dependence on wheelchair: Secondary | ICD-10-CM | POA: Insufficient documentation

## 2012-02-01 DIAGNOSIS — Z86718 Personal history of other venous thrombosis and embolism: Secondary | ICD-10-CM | POA: Insufficient documentation

## 2012-02-01 DIAGNOSIS — R109 Unspecified abdominal pain: Secondary | ICD-10-CM | POA: Insufficient documentation

## 2012-02-01 LAB — CBC WITH DIFFERENTIAL/PLATELET
Basophils Relative: 0 % (ref 0–1)
HCT: 37.8 % (ref 36.0–46.0)
Hemoglobin: 13.2 g/dL (ref 12.0–15.0)
MCH: 28.9 pg (ref 26.0–34.0)
MCHC: 34.9 g/dL (ref 30.0–36.0)
MCV: 82.7 fL (ref 78.0–100.0)
Monocytes Absolute: 0.8 10*3/uL (ref 0.1–1.0)
Monocytes Relative: 10 % (ref 3–12)
Neutro Abs: 6.4 10*3/uL (ref 1.7–7.7)

## 2012-02-01 LAB — COMPREHENSIVE METABOLIC PANEL
Albumin: 1.9 g/dL — ABNORMAL LOW (ref 3.5–5.2)
BUN: 46 mg/dL — ABNORMAL HIGH (ref 6–23)
Chloride: 98 mEq/L (ref 96–112)
Creatinine, Ser: 0.95 mg/dL (ref 0.50–1.10)
GFR calc non Af Amer: 51 mL/min — ABNORMAL LOW (ref >90)
Total Bilirubin: 1 mg/dL (ref 0.3–1.2)

## 2012-02-01 LAB — URINALYSIS, ROUTINE W REFLEX MICROSCOPIC
Leukocytes, UA: NEGATIVE
Nitrite: NEGATIVE
Protein, ur: NEGATIVE mg/dL
Urobilinogen, UA: 1 mg/dL (ref 0.0–1.0)

## 2012-02-01 LAB — LIPASE, BLOOD: Lipase: 17 U/L (ref 11–59)

## 2012-02-01 MED ORDER — FENTANYL CITRATE 0.05 MG/ML IJ SOLN
25.0000 ug | Freq: Once | INTRAMUSCULAR | Status: AC
  Administered 2012-02-01: 25 ug via INTRAVENOUS
  Filled 2012-02-01: qty 2

## 2012-02-01 MED ORDER — ONDANSETRON HCL 4 MG/2ML IJ SOLN
4.0000 mg | Freq: Once | INTRAMUSCULAR | Status: AC
  Administered 2012-02-01: 4 mg via INTRAVENOUS
  Filled 2012-02-01: qty 2

## 2012-02-01 MED ORDER — LACTATED RINGERS IV BOLUS (SEPSIS)
500.0000 mL | Freq: Once | INTRAVENOUS | Status: AC
  Administered 2012-02-01: 500 mL via INTRAVENOUS

## 2012-02-01 MED ORDER — DOCUSATE SODIUM 100 MG PO CAPS: 100.0000 mg | ORAL_CAPSULE | Freq: Two times a day (BID) | ORAL | Status: AC

## 2012-02-01 MED ORDER — POLYETHYLENE GLYCOL 3350 17 GM/SCOOP PO POWD: 17.0000 g | Freq: Every day | ORAL | Status: AC

## 2012-02-01 MED ORDER — MILK AND MOLASSES ENEMA
Freq: Once | RECTAL | Status: AC
  Administered 2012-02-01: 19:00:00 via RECTAL
  Filled 2012-02-01: qty 250

## 2012-02-01 MED ORDER — LACTATED RINGERS IV SOLN
INTRAVENOUS | Status: DC
  Administered 2012-02-01: 19:00:00 via INTRAVENOUS

## 2012-02-01 NOTE — ED Notes (Signed)
JWJ:[id number]<NW> Expected date:02/01/12<BR> Expected time: 2:30 PM<BR> Means of arrival:<BR> Comments:<BR> Abd Pain

## 2012-02-01 NOTE — ED Notes (Signed)
MD at bedside. 

## 2012-02-01 NOTE — ED Notes (Addendum)
Per EMS: adbominal pain when daughter tried to sit her up this morning. Pacemaker. Per Daughter: pain has been going on for a couple of days along with a productive cough.

## 2012-02-01 NOTE — ED Notes (Signed)
RN to obtain labs with start of IV 

## 2012-02-01 NOTE — ED Provider Notes (Signed)
History     CSN: [id number]  Arrival date & time 02/01/12  1435   First MD Initiated Contact with Patient 02/01/12 1457      Chief Complaint  Patient presents with  . Abdominal Pain    (Consider location/radiation/quality/duration/timing/severity/associated sxs/prior treatment) HPIVirginia [name] is a 78 y.o. female resenting to 2 days history of abdominal pain and cough. Zipper cough of clear white sputum for 2 days, no fevers, no chills. No sick contacts no recent home. Patient denies any dysuria or frequency, vaginal pain, melena, hematochezia, headache, chest pain or shortness of breath. Her abdominal pain is in the upper abdomen, is severe, it feels like "seasickness" does not radiate, is no alleviating or exacerbating factors. Never had this before. Patient has a pacemaker in no history of coronary artery disease.   Past Medical History  Diagnosis Date  . Hyperlipidemia   . Chronic ulcer of right leg   . HTN (hypertension)   . Symptomatic bradycardia     a. 08/08/2003 s/p MDT [id number] Sigma 300 PPM (VVI), Ser # [id number] H.  . Kyphosis     severe  . Atrial fibrillation   . Dementia   . History of DVT (deep vein thrombosis)     a. 08/2011 acute Right brachial, axillary, and subclavian DVTs.  . Normocytic anemia   . Physical deconditioning     a. wheelchair bound    Past Surgical History  Procedure Date  . Permanent pacemaker     Family History  Problem Relation Age of Onset  . Cancer Son   . Heart failure Daughter   . Other Mother     Deceased "65's" - Old Age  . Other Father     Deceased "10's" - homicide    History  Substance Use Topics  . Smoking status: Never Smoker   . Smokeless tobacco: Current User    Types: Snuff  . Alcohol Use: No    OB History    Grav Para Term Preterm Abortions TAB SAB Ect Mult Living                  Review of Systems At least 10pt or greater review of systems completed and are negative except where specified in the  HPI.  Allergies  Review of patient's allergies indicates no known allergies.  Home Medications   Current Outpatient Rx  Name  Route  Sig  Dispense  Refill  . ACETAMINOPHEN ER 650 MG PO TBCR   Oral   Take 325 mg by mouth every 6 (six) hours as needed. For pain         . MIRTAZAPINE 7.5 MG PO TABS   Oral   Take 1 tablet (7.5 mg total) by mouth at bedtime.   30 tablet   0     BP 98/63  Pulse 71  Temp 97.6 F (36.4 C) (Oral)  Resp 20  SpO2 99%  Physical Exam  Nursing notes reviewed.  Electronic medical record reviewed. VITAL SIGNS:   Filed Vitals:   02/01/12 1437 02/01/12 1453  BP:  98/63  Pulse:  71  Temp:  97.6 F (36.4 C)  TempSrc:  Oral  Resp:  20  SpO2: 98% 99%   CONSTITUTIONAL: Awake, oriented, appears frail HENT: Atraumatic, normocephalic, oral mucosa pink and moist, airway patent. Nares patent without drainage. External ears normal. EYES: Conjunctiva clear, EOMI, PERRLA NECK: Trachea midline, non-tender, supple CARDIOVASCULAR: Normal heart rate, Normal rhythm, No murmurs, rubs, gallops PULMONARY/CHEST: Clear to auscultation, no rhonchi,  wheezes, or rales. Symmetrical breath sounds. Non-tender. ABDOMINAL: Non-distended, scaphoid, soft, mildly tender to palpation in the midepigastrium without rebound tenderness.  BS normal. NEUROLOGIC: Non-focal, moving all four extremities, no gross sensory or motor deficits. EXTREMITIES: No clubbing, cyanosis, or edema SKIN: Warm, Dry, No erythema. Stage I sacral decubitus ulcer  ED Course  Procedures (including critical care time)  Labs Reviewed  CBC WITH DIFFERENTIAL - Abnormal; Notable for the following:    RDW 18.3 (*)     Platelets 139 (*)     Neutrophils Relative 78 (*)     All other components within normal limits  COMPREHENSIVE METABOLIC PANEL - Abnormal; Notable for the following:    BUN 46 (*)     Total Protein 5.8 (*)     Albumin 1.9 (*)     GFR calc non Af Amer 51 (*)     GFR calc Af Amer 59 (*)      All other components within normal limits  URINALYSIS, ROUTINE W REFLEX MICROSCOPIC - Abnormal; Notable for the following:    Color, Urine AMBER (*)  BIOCHEMICALS MAY BE AFFECTED BY COLOR   Bilirubin Urine LARGE (*)     Ketones, ur TRACE (*)     All other components within normal limits  LIPASE, BLOOD  OCCULT BLOOD, POC DEVICE   Dg Chest 2 View  02/01/2012  *RADIOLOGY REPORT*  Clinical Data: Abdominal pain and weakness.  CHEST - 2 VIEW  Comparison: CT and plain films the chest 10/10/2011.  Findings: Lungs are clear.  Marked cardiomegaly is again seen. There are trace bilateral pleural effusions.  No pneumothorax or focal airspace disease.  IMPRESSION: Marked cardiomegaly and trace bilateral pleural effusions.   Original Report Authenticated By: Holley Dexter, M.D.    Dg Abd 2 Views  02/01/2012  *RADIOLOGY REPORT*  Clinical Data: Abdominal pain and weakness.  ABDOMEN - 2 VIEW  Comparison: Chest and two views abdomen 11/24/2011.  Findings: No free peritoneal air is identified.  There is no evidence of bowel obstruction.  Large stool ball rectosigmoid colon noted.  IMPRESSION: No acute finding.  Large stool ball rectosigmoid colon compatible with fecal impaction.   Original Report Authenticated By: Holley Dexter, M.D.      1. Abdominal pain   2. Constipation     Medications  polyethylene glycol powder (GLYCOLAX) powder (not administered)  docusate sodium (COLACE) 100 MG capsule (not administered)  fentaNYL (SUBLIMAZE) injection 25 mcg (25 mcg Intravenous Given 02/01/12 1650)  ondansetron (ZOFRAN) injection 4 mg (4 mg Intravenous Given 02/01/12 1648)  lactated ringers bolus 500 mL (0 mL Intravenous Stopped 02/01/12 1806)  milk and molasses enema (  Rectal Given 02/01/12 1857)     MDM  [name] is a 78 y.o. female presents with abdominal pain as well as a productive cough for a few days. Concern for constipation with patient's dementia and generalized deconditioning.  X-ray of the chest shows no pneumonia, edema and mention of a large stool ball in the rectosigmoid colon compatible with fecal impaction. Labs are otherwise unremarkable. Patient does appear slightly dehydrated. She is given fluids by IV.  I manually disimpacted the patient, she had a large quantity of soft stool in the vault, there was no gross blood. Patient was then given an enema - she is unable to cooperate with retention enemas however.  Patient is supposed to be taking GlycoLax however is not currently. I will recommend GlycoLax once or twice a day plus stool softeners to help move  her bowels.  I do not think anything life-threatening is going on the patient's abdomen at this time, her abdominal exam is benign, vitals are stable and within normal limits.  I explained the diagnosis and have given explicit precautions to return to the ER including any other new or worsening symptoms. The patient understands and accepts the medical plan as it's been dictated and I have answered their questions. Discharge instructions concerning home care and prescriptions have been given.  The patient is STABLE and is discharged to home in good condition.         Jones Skene, MD 02/02/12 [phone]

## 2012-02-01 NOTE — ED Notes (Signed)
Patient transported to X-ray 

## 2012-03-10 DEATH — deceased

## 2012-05-23 ENCOUNTER — Encounter: Payer: Self-pay | Admitting: Internal Medicine

## 2012-07-06 ENCOUNTER — Encounter: Payer: Self-pay | Admitting: Internal Medicine

## 2012-07-06 ENCOUNTER — Telehealth: Payer: Self-pay | Admitting: Internal Medicine

## 2012-07-06 NOTE — Telephone Encounter (Signed)
07-06-12 sent past due letter/mt

## 2012-08-06 ENCOUNTER — Encounter: Payer: Self-pay | Admitting: Internal Medicine

## 2012-08-06 ENCOUNTER — Telehealth: Payer: Self-pay | Admitting: Internal Medicine

## 2012-08-06 NOTE — Telephone Encounter (Signed)
08-06-12 sent past due certified letter/mt °

## 2012-09-07 ENCOUNTER — Telehealth: Payer: Self-pay | Admitting: Internal Medicine

## 2012-09-07 NOTE — Telephone Encounter (Signed)
09-07-12 past 30 days, sent certified letter 08-06-12/mt

## 2012-11-16 ENCOUNTER — Telehealth: Payer: Self-pay | Admitting: Internal Medicine

## 2012-11-16 NOTE — Telephone Encounter (Signed)
Updated [employer]

## 2012-11-16 NOTE — Telephone Encounter (Signed)
11-16-12 certified letter rtn , pt deceased/mt

## 2013-09-13 IMAGING — CR DG CHEST 2V
2 series · 2 of 2 positions shown · non-contrast
Comparison: 08/03/2011 and earlier.

CLINICAL DATA: [AGE] female with weakness and shortness of
breath.

CHEST - 2 VIEW

[w chest lat]
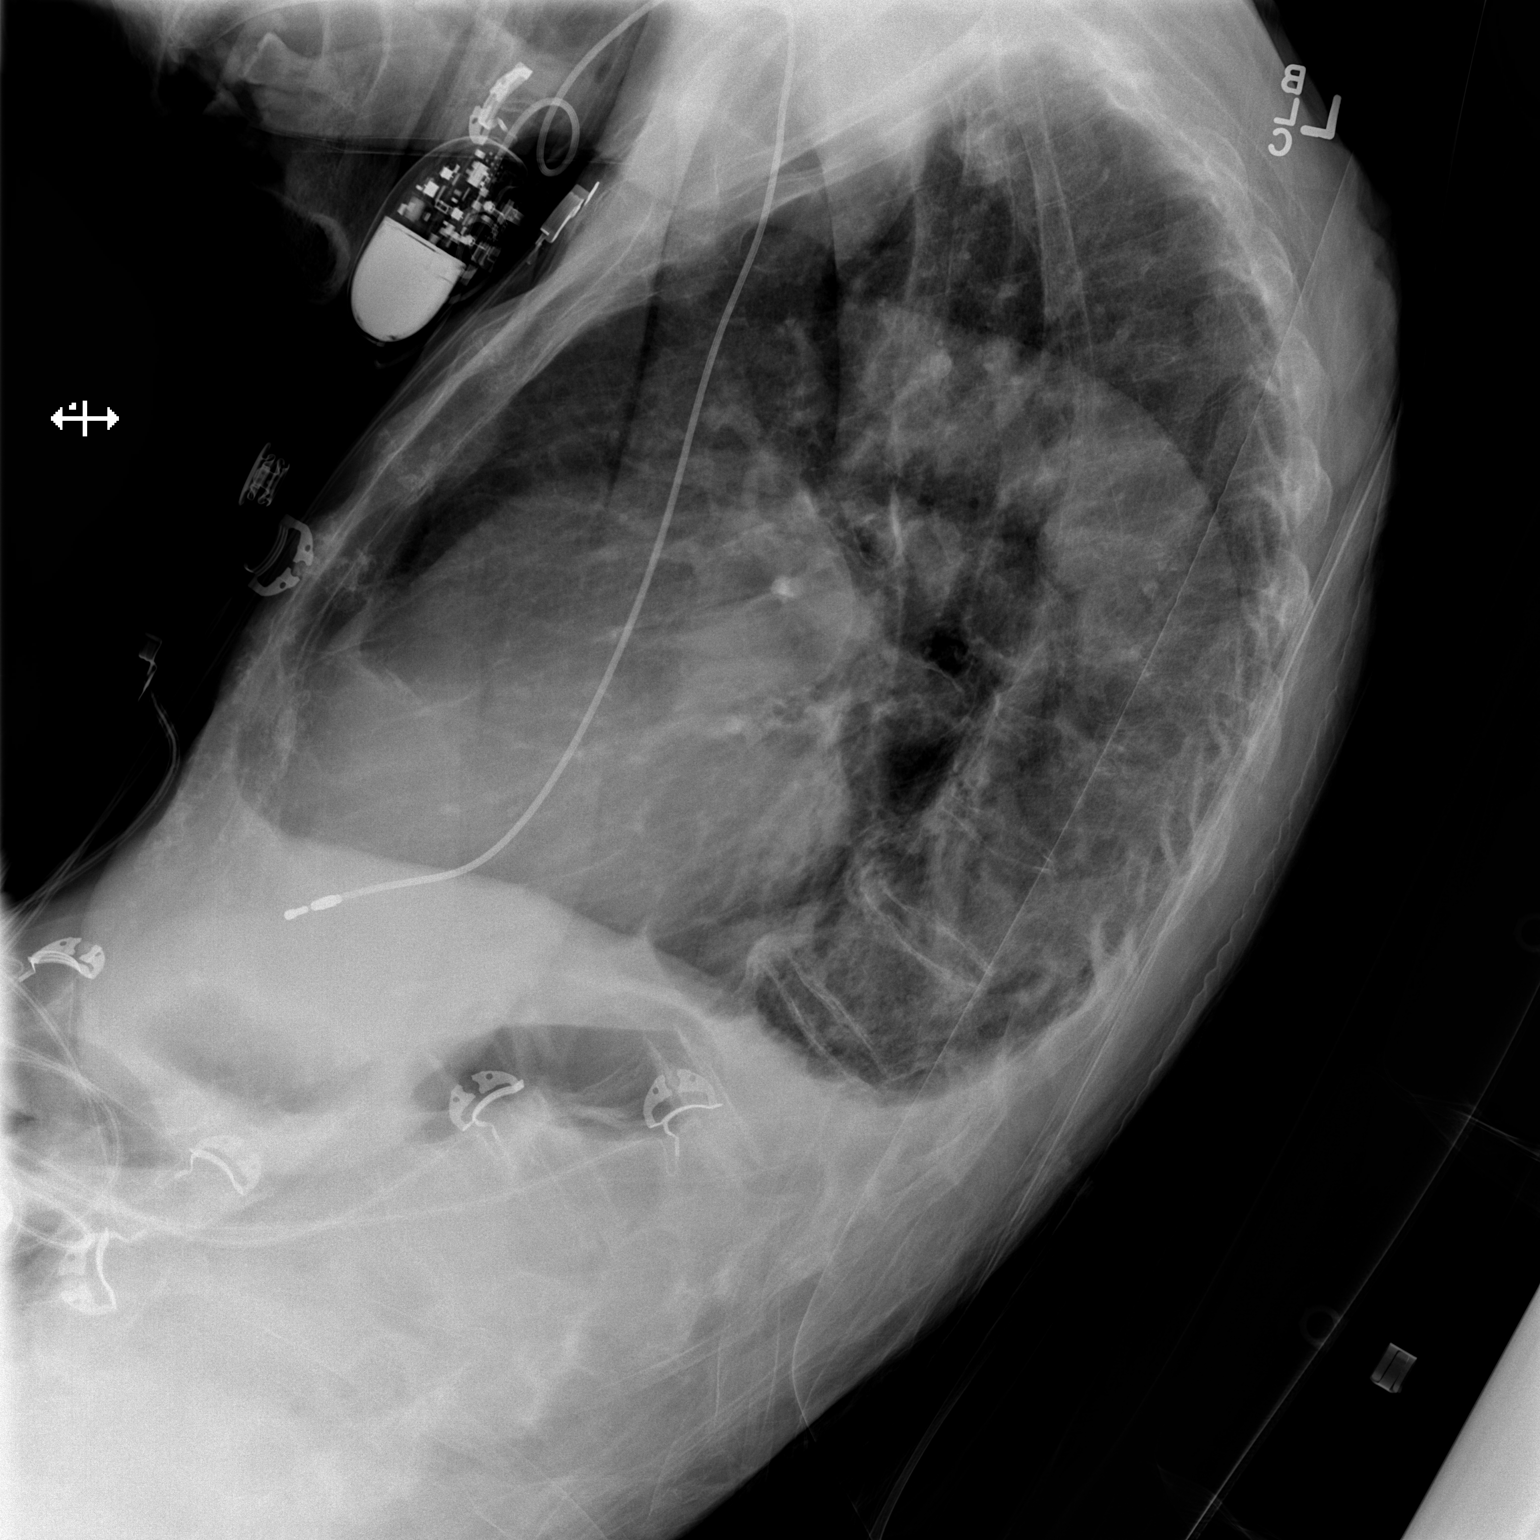

[x chest ap]
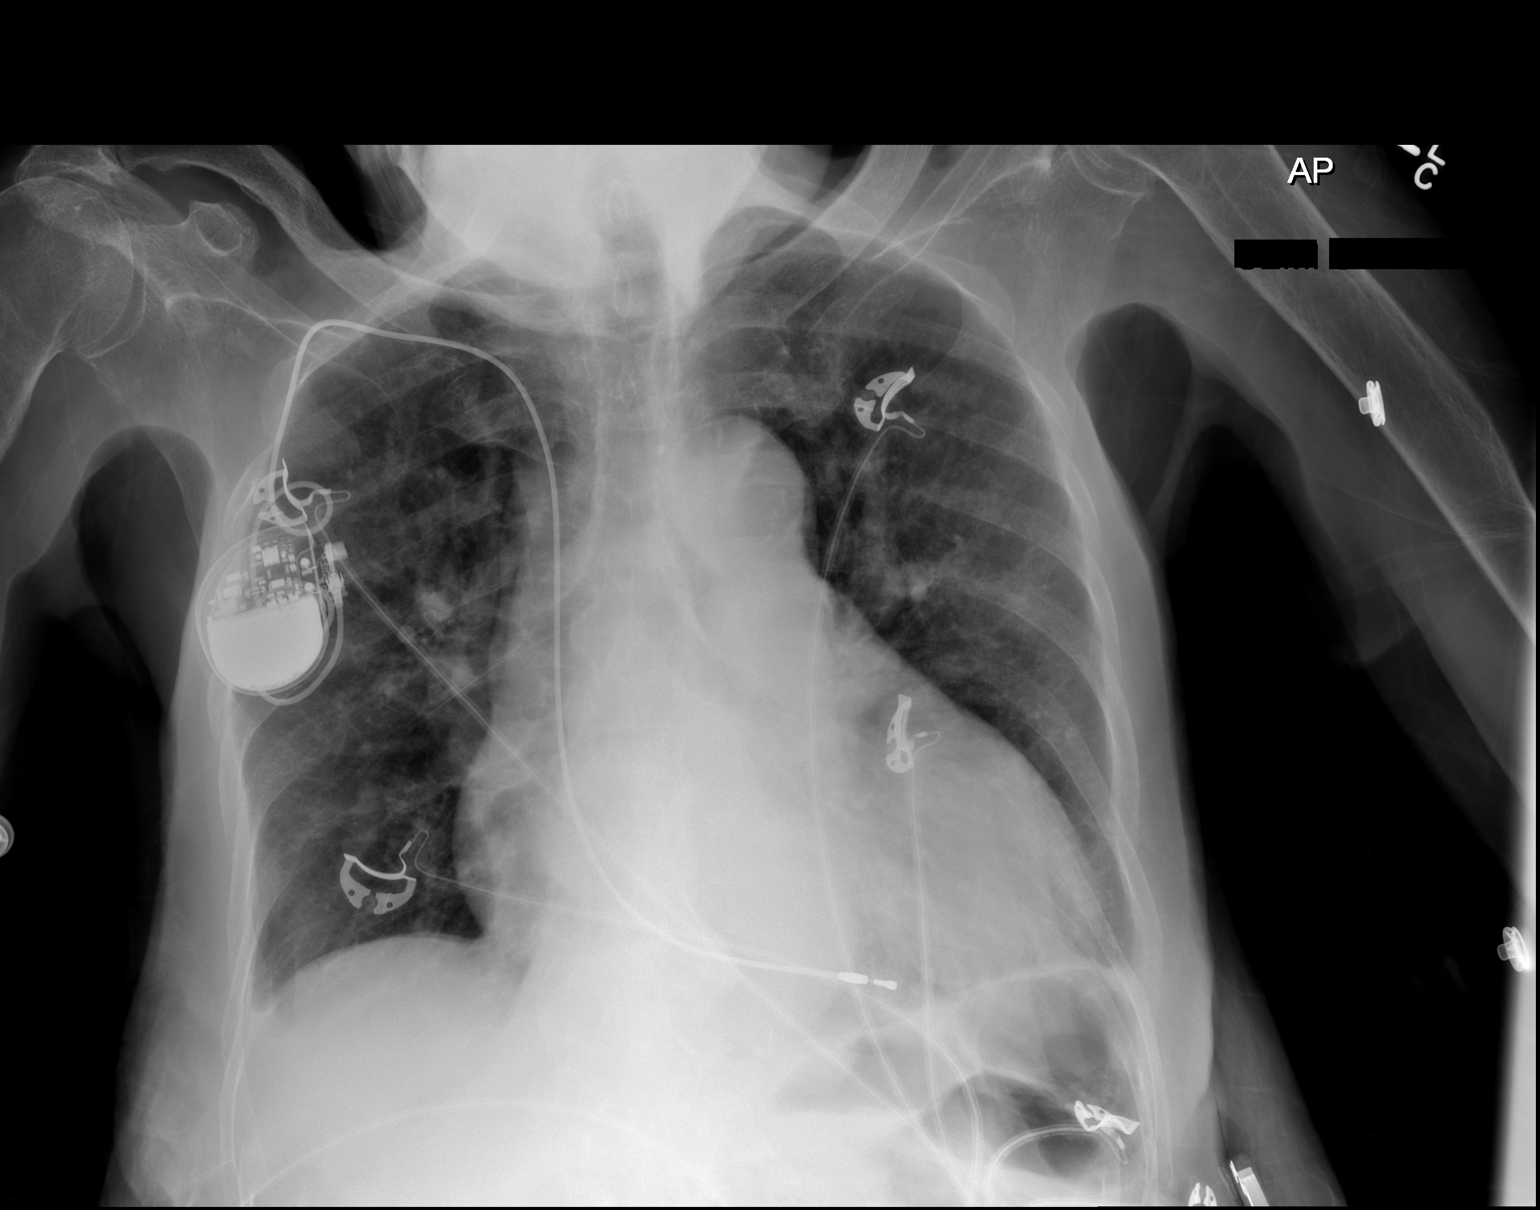

[2 of 2 positions shown; findings below may reference images not displayed]

FINDINGS: Chronic cardiomegaly.  Stable mediastinal contours.
Stable right chest single lead cardiac pacemaker.  No pneumothorax
or pulmonary edema.  There are small pleural effusions.  No
consolidation.  Osteopenia. Visualized tracheal air column is
within normal limits.
IMPRESSION: Small pleural effusions, otherwise stable chronic cardiomegaly and
no other acute cardiopulmonary abnormality.

## 2013-09-13 IMAGING — CT CT ANGIO CHEST
1 of 2 series · 19 of 32 positions shown · IV contrast (omnipaque)
Comparison: None

CLINICAL DATA: Shortness of breath question pulmonary embolism,
left lower quadrant pain, left shoulder and facial pain; history
hypertension

CT ANGIOGRAPHY CHEST
TECHNIQUE: Multidetector CT imaging of the chest using the
standard protocol during bolus administration of intravenous
contrast. Multiplanar reconstructed images including MIPs were
obtained and reviewed to evaluate the vascular anatomy.
Contrast: 100mL OMNIPAQUE IOHEXOL 350 MG/ML SOLN

[Series 6: thins for pacs · axial · 0.61mm/px · z∈[+90,+324]mm · 19 of 262 slices shown]
[im 14/262  lung]
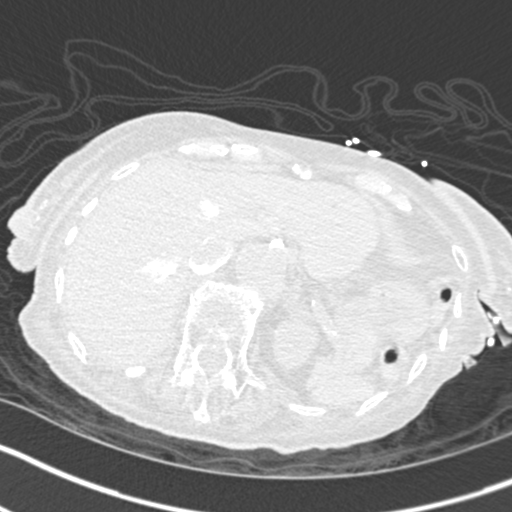
[im 27/262  mediastinal]
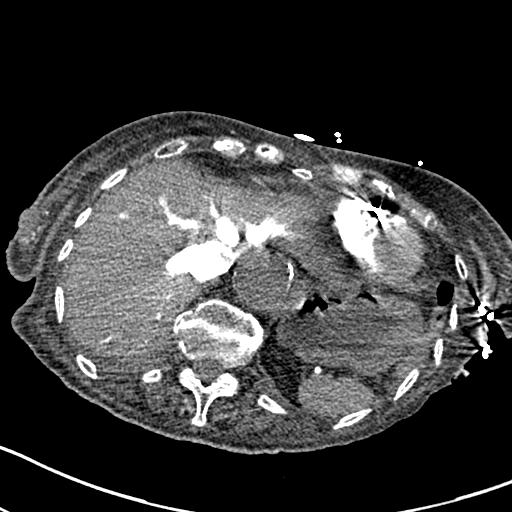
[im 40/262  lung]
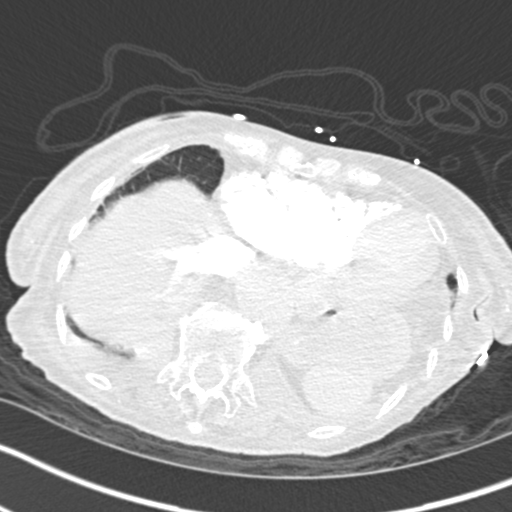
[im 66/262  mediastinal]
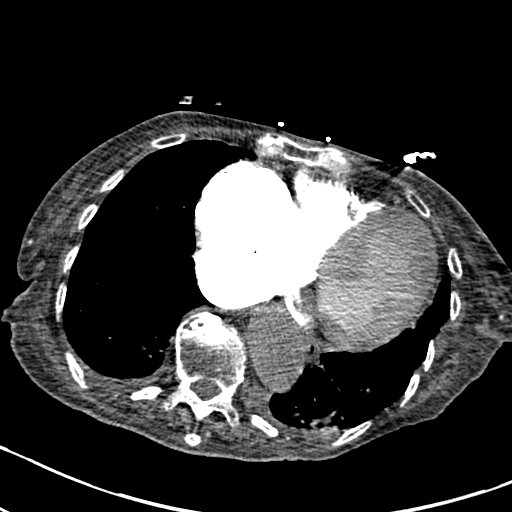
[im 79/262  lung]
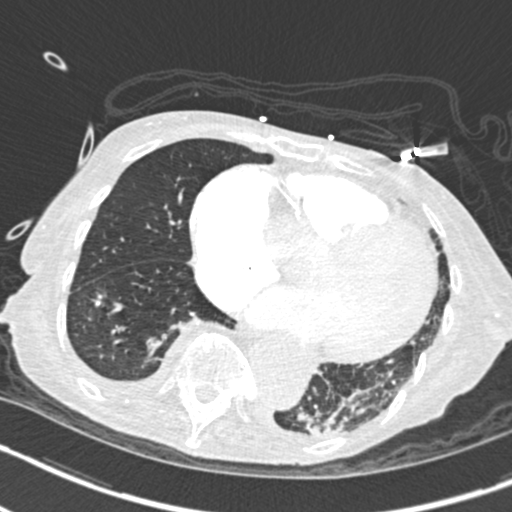
[im 88/262  mediastinal]
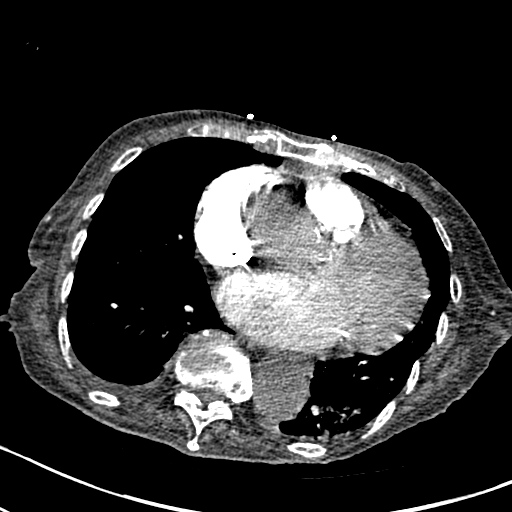
[im 92/262  lung]
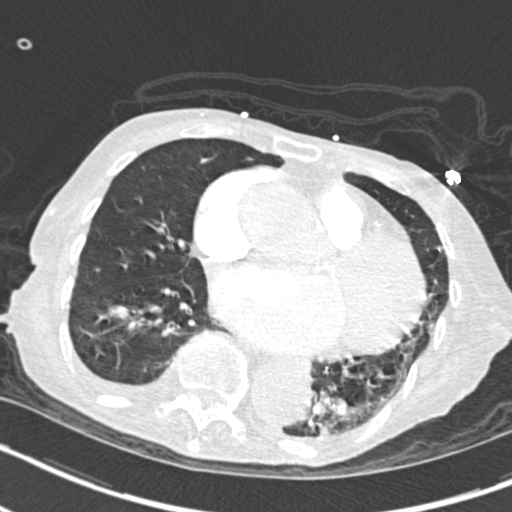
[im 105/262  mediastinal]
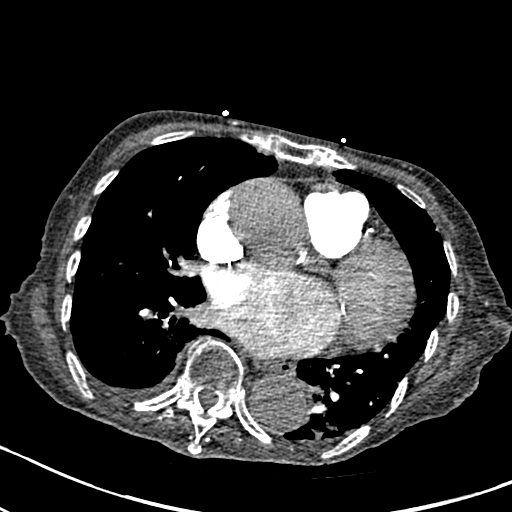
[im 118/262  lung]
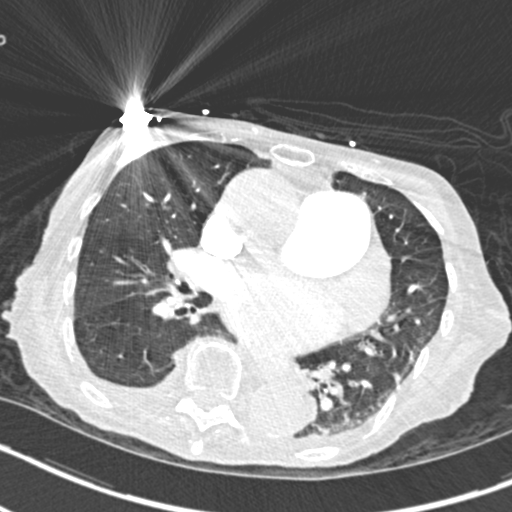
[im 131/262  mediastinal]
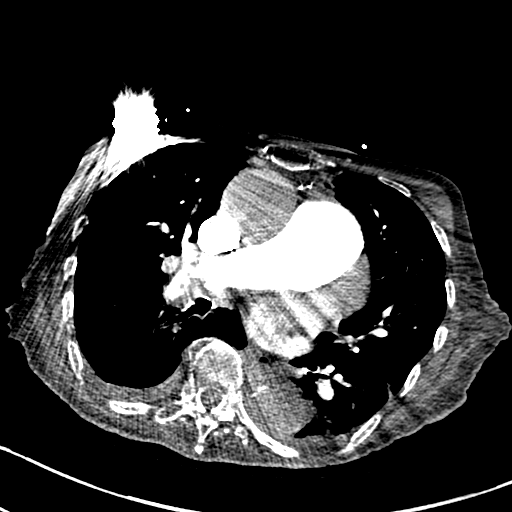
[im 144/262  lung]
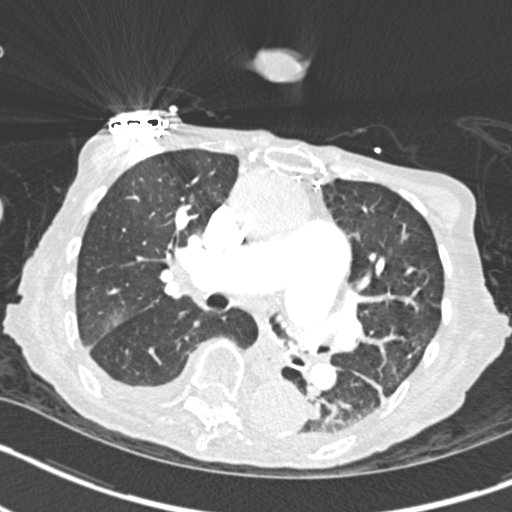
[im 157/262  mediastinal]
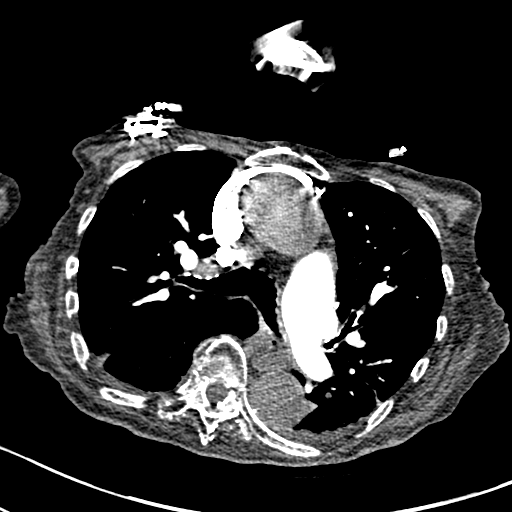
[im 170/262  lung]
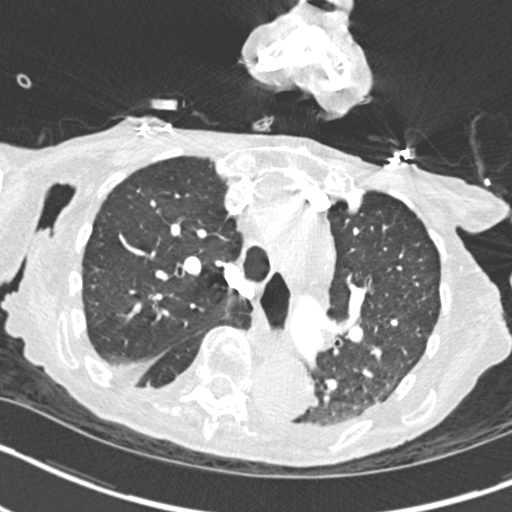
[im 175/262  mediastinal]
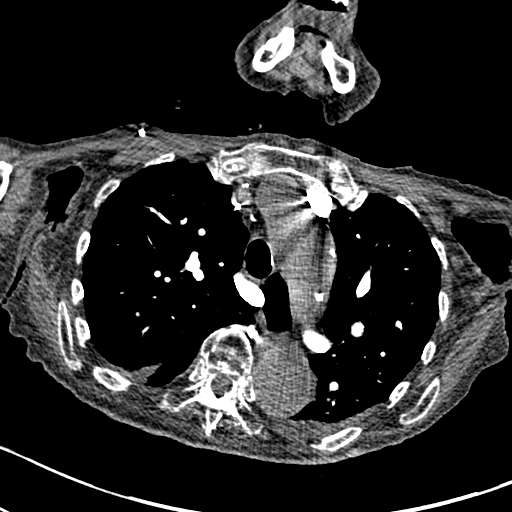
[im 183/262  lung]
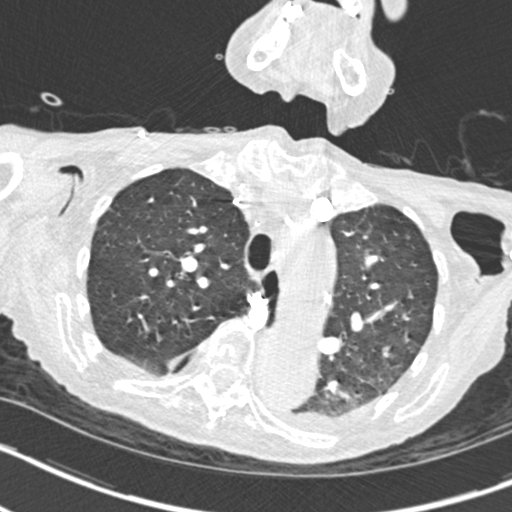
[im 196/262  mediastinal]
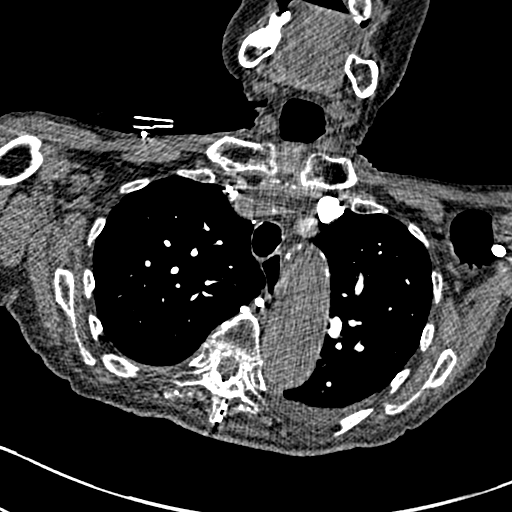
[im 222/262  lung]
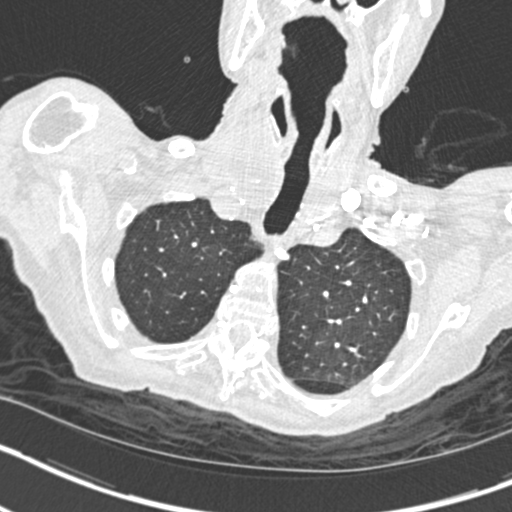
[im 235/262  mediastinal]
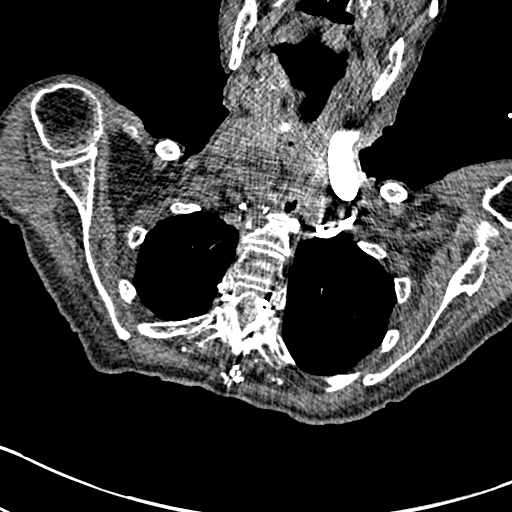
[im 248/262  lung]
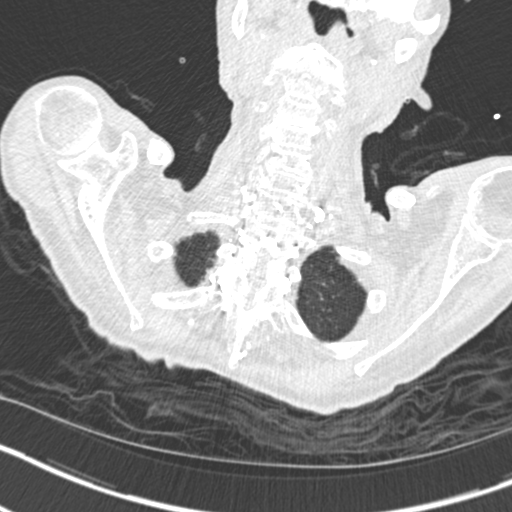

[19 of 32 positions shown; findings below may reference images not displayed]

FINDINGS: Atherosclerotic calcification aorta with aneurysmal dilatation of
ascending thoracic aorta 4.3 x 4.5 cm image 42.
Enlarged central pulmonary arteries.
Pulmonary vascular cephalization.
Scattered respiratory motion artifacts present at the lower lobes,
creating artifacts at pulmonary arteries especially in the left
lower lobe.
Bibasilar atelectasis.
Tortuous descending thoracic aorta with aneurysmal dilatation,
aorta measuring 3.6 x 3.5 cm diameter at the aortic hiatus.
No definite pulmonary emboli identified.
Small bilateral pleural effusions.

Reflux of contrast air into the IVC and hepatic veins.
Small cysts left lobe liver.
Emphysematous and bronchitic changes compatible with COPD.
Pacemaker pack right chest with leads in right atrium and right
ventricle.
Heart is enlarged with significant enlargement of the atria.
Remaining lungs clear.
Bones severely demineralized.
Cervicothoracic kyphosis and question thoracic ankylosis.
IMPRESSION: No evidence of pulmonary embolism.
Atherosclerotic changes with aneurysmal dilatation of the ascending
and descending thoracic aorta.
Enlarged central pulmonary arteries question pulmonary arterial
hypertension. Enlargement of cardiac chambers particularly the
atria.
Changes of COPD with bibasilar atelectasis and small bilateral
pleural effusions.

## 2013-11-22 NOTE — Telephone Encounter (Signed)
Nothing was typed/tmj
